# Patient Record
Sex: Female | Born: 1960
Health system: Southern US, Community
[De-identification: ages and names within clinical notes are randomized; demographics above are authoritative.]

## PROBLEM LIST (undated history)

## (undated) DIAGNOSIS — K219 Gastro-esophageal reflux disease without esophagitis: Secondary | ICD-10-CM

## (undated) DIAGNOSIS — M8580 Other specified disorders of bone density and structure, unspecified site: Secondary | ICD-10-CM

## (undated) DIAGNOSIS — E039 Hypothyroidism, unspecified: Secondary | ICD-10-CM

## (undated) DIAGNOSIS — I82409 Acute embolism and thrombosis of unspecified deep veins of unspecified lower extremity: Secondary | ICD-10-CM

## (undated) HISTORY — PX: WISDOM TOOTH EXTRACTION: SHX21

## (undated) HISTORY — DX: Other specified disorders of bone density and structure, unspecified site: M85.80

## (undated) HISTORY — DX: Hypothyroidism, unspecified: E03.9

---

## 2007-03-02 DIAGNOSIS — I82409 Acute embolism and thrombosis of unspecified deep veins of unspecified lower extremity: Secondary | ICD-10-CM

## 2007-03-02 HISTORY — DX: Acute embolism and thrombosis of unspecified deep veins of unspecified lower extremity: I82.409

## 2011-08-02 ENCOUNTER — Encounter (HOSPITAL_COMMUNITY): Payer: Self-pay | Admitting: Emergency Medicine

## 2011-08-02 ENCOUNTER — Emergency Department (HOSPITAL_COMMUNITY)
Admission: EM | Admit: 2011-08-02 | Discharge: 2011-08-03 | Disposition: A | Discharge status: Home / Self Care | Payer: BC Managed Care – PPO | Attending: Emergency Medicine | Admitting: Emergency Medicine

## 2011-08-02 ENCOUNTER — Emergency Department (HOSPITAL_COMMUNITY): Payer: BC Managed Care – PPO

## 2011-08-02 DIAGNOSIS — Z86718 Personal history of other venous thrombosis and embolism: Secondary | ICD-10-CM | POA: Insufficient documentation

## 2011-08-02 DIAGNOSIS — I82409 Acute embolism and thrombosis of unspecified deep veins of unspecified lower extremity: Secondary | ICD-10-CM | POA: Insufficient documentation

## 2011-08-02 DIAGNOSIS — R079 Chest pain, unspecified: Secondary | ICD-10-CM

## 2011-08-02 DIAGNOSIS — K219 Gastro-esophageal reflux disease without esophagitis: Secondary | ICD-10-CM | POA: Insufficient documentation

## 2011-08-02 DIAGNOSIS — E039 Hypothyroidism, unspecified: Secondary | ICD-10-CM | POA: Insufficient documentation

## 2011-08-02 HISTORY — DX: Gastro-esophageal reflux disease without esophagitis: K21.9

## 2011-08-02 HISTORY — DX: Acute embolism and thrombosis of unspecified deep veins of unspecified lower extremity: I82.409

## 2011-08-02 LAB — DIFFERENTIAL
Basophils Absolute: 0.1 10*3/uL (ref 0.0–0.1)
Basophils Relative: 1 % (ref 0–1)
Eosinophils Absolute: 0.2 10*3/uL (ref 0.0–0.7)
Neutro Abs: 3.9 10*3/uL (ref 1.7–7.7)
Neutrophils Relative %: 58 % (ref 43–77)

## 2011-08-02 LAB — POCT I-STAT TROPONIN I: Troponin i, poc: 0.02 ng/mL (ref 0.00–0.08)

## 2011-08-02 LAB — COMPREHENSIVE METABOLIC PANEL
ALT: 16 U/L (ref 0–35)
AST: 18 U/L (ref 0–37)
Albumin: 4.1 g/dL (ref 3.5–5.2)
Alkaline Phosphatase: 68 U/L (ref 39–117)
Chloride: 101 mEq/L (ref 96–112)
Potassium: 3.7 mEq/L (ref 3.5–5.1)
Sodium: 140 mEq/L (ref 135–145)
Total Bilirubin: 0.2 mg/dL — ABNORMAL LOW (ref 0.3–1.2)
Total Protein: 7.3 g/dL (ref 6.0–8.3)

## 2011-08-02 LAB — CBC
Hemoglobin: 13.3 g/dL (ref 12.0–15.0)
MCH: 30.6 pg (ref 26.0–34.0)
MCHC: 34 g/dL (ref 30.0–36.0)
Platelets: 295 10*3/uL (ref 150–400)
RBC: 4.34 MIL/uL (ref 3.87–5.11)

## 2011-08-02 NOTE — ED Notes (Signed)
Patient declined continuous cardiac monitor at this time, states, "My EKG came back fine, I don't know about my blood work, but I really am feeling fine and am hoping to go home soon."  RN Kim informed

## 2011-08-02 NOTE — ED Notes (Signed)
Dr.Wentz to eval ecg 20:15

## 2011-08-02 NOTE — ED Provider Notes (Signed)
History     CSN: [id number]  Arrival date & time 08/02/11  2011   First MD Initiated Contact with Patient 08/02/11 2317      Chief Complaint  Patient presents with  . Chest Pain    (Consider location/radiation/quality/duration/timing/severity/associated sxs/prior treatment) HPI Comments: 51 y/o female with no sig PMH other than isolated DVT in 2008 after leg fracture - spent 9 months on anticoagulation and then has had no problems since. She does travel for work on an airplane frequently but has had no leg swelling, no shortness of breath, back pain. She states that she awoke this morning with a feeling of pressure in the middle of her chest which is consistent with her frequent acid reflux disease. She had some improvement in her symptoms after taking her reflux medication but this persisted throughout most of the day. At this time it has resolved. During dinner this evening she developed a more intense crampy left-sided pain under her left breast which lasted approximately one hour and then resolved spontaneously. At this time she has no complaints including no shortness of breath, no chest pain and no lower extremity symptoms. This was not made worse with breathing. She states that she exercises several times a week, she is able to do an elliptical machine 2 miles at a time, 200 symptoms today along with weight lifting routines. She has no problems with exertion. She also states that she has had a recent cardiac evaluation with what she describes as a CT coronary evaluation which she states she was told was clean of any coronary blockages. She does not have hypertension no diabetes, no smoking, no cholesterol and she reports that she is adopted and is unsure of her family history.  Patient is a 51 y.o. female presenting with chest pain. The history is provided by the patient.  Chest Pain     Past Medical History  Diagnosis Date  . DVT (deep venous thrombosis)   . Hypothyroid   . GERD  (gastroesophageal reflux disease)     History reviewed. No pertinent past surgical history.  History reviewed. No pertinent family history.  History  Substance Use Topics  . Smoking status: Not on file  . Smokeless tobacco: Not on file  . Alcohol Use: No    OB History    Grav Para Term Preterm Abortions TAB SAB Ect Mult Living                  Review of Systems  Cardiovascular: Positive for chest pain.  All other systems reviewed and are negative.    Allergies  Review of patient's allergies indicates no known allergies.  Home Medications   Current Outpatient Rx  Name Route Sig Dispense Refill  . ASPIRIN EC 81 MG PO TBEC Oral Take 81 mg by mouth daily.    Marland Kitchen CALCIUM 600 + D PO Oral Take 1 tablet by mouth daily.    Marland Kitchen FLAXSEED (LINSEED) PO Oral Take 1 tablet by mouth daily.    Marland Kitchen LANSOPRAZOLE 15 MG PO CPDR Oral Take 15 mg by mouth daily.    Marland Kitchen LEVOTHYROXINE SODIUM 88 MCG PO TABS Oral Take 88 mcg by mouth See admin instructions. Take every day except Sundays.    . ADULT MULTIVITAMIN W/MINERALS CH Oral Take 1 tablet by mouth daily.    Marland Kitchen FISH OIL PO Oral Take 720 mg by mouth daily.    Marland Kitchen VITAMIN C 500 MG PO TABS Oral Take 500 mg by mouth daily.  BP 128/79  Pulse 61  Temp(Src) 98.3 F (36.8 C) (Oral)  Resp 16  SpO2 100%  Physical Exam  Nursing note and vitals reviewed. Constitutional: She appears well-developed and well-nourished. No distress.  HENT:  Head: Normocephalic and atraumatic.  Mouth/Throat: Oropharynx is clear and moist. No oropharyngeal exudate.  Eyes: Conjunctivae and EOM are normal. Pupils are equal, round, and reactive to light. Right eye exhibits no discharge. Left eye exhibits no discharge. No scleral icterus.  Neck: Normal range of motion. Neck supple. No JVD present. No thyromegaly present.  Cardiovascular: Normal rate, regular rhythm, normal heart sounds and intact distal pulses.  Exam reveals no gallop and no friction rub.   No murmur  heard. Pulmonary/Chest: Effort normal and breath sounds normal. No respiratory distress. She has no wheezes. She has no rales.  Abdominal: Soft. Bowel sounds are normal. She exhibits no distension and no mass. There is no tenderness.  Musculoskeletal: Normal range of motion. She exhibits no edema and no tenderness.  Lymphadenopathy:    She has no cervical adenopathy.  Neurological: She is alert. Coordination normal.  Skin: Skin is warm and dry. No rash noted. No erythema.  Psychiatric: She has a normal mood and affect. Her behavior is normal.    ED Course  Procedures (including critical care time)  ED ECG REPORT   Date: 08/03/2011   Rate: 55  Rhythm: sinus bradycardia  QRS Axis: normal  Intervals: normal  ST/T Wave abnormalities: normal  Conduction Disutrbances:none  Narrative Interpretation:   Old EKG Reviewed: none available   Labs Reviewed  COMPREHENSIVE METABOLIC PANEL - Abnormal; Notable for the following:    Total Bilirubin 0.2 (*)    GFR calc non Af Amer 63 (*)    GFR calc Af Amer 73 (*)    All other components within normal limits  CBC  DIFFERENTIAL  POCT I-STAT TROPONIN I  D-DIMER, QUANTITATIVE   Dg Chest 2 View  08/02/2011  *RADIOLOGY REPORT*  Clinical Data: Left-sided chest pain.  CHEST - 2 VIEW  Comparison: No priors.  Findings: Lung volumes are normal.  No consolidative airspace disease.  No pleural effusions.  No pneumothorax.  No pulmonary nodule or mass noted.  Pulmonary vasculature and the cardiomediastinal silhouette are within normal limits. Atherosclerotic calcifications in the arch of the aorta.  IMPRESSION: 1. No radiographic evidence of acute cardiopulmonary disease. 2.  Atherosclerosis.  Original Report Authenticated By: Florencia Reasons, M.D.     1. Chest pain       MDM  Physical exam is benign, there is no signs of lower extremity abnormalities, no signs of deep venous thrombosis, normal vital signs including pulse respirations and oxygen  saturations. Her chest x-ray is negative for acute findings and her lab work is unremarkable. Will add a d-dimer to rule out pulmonary embolism due to the patient's history of venous thrombosis as well as her history of frequent travel.   D-dimer negative, patient stable for discharge, low risk for pulmonary embolism given normal d-dimer, very low risk for acute coronary syndrome. Patient informed of results    Vida Roller, MD 08/03/11 [phone]

## 2011-08-02 NOTE — ED Notes (Signed)
Patient reports having indigestion most of the day; after eating dinner, began to experience left-sided chest pain.  Denies radiation of pain, shortness of breath, nausea, vomiting, and dizziness.  Denies cardiac history.

## 2011-08-02 NOTE — ED Notes (Signed)
MD at bedside. 

## 2011-08-03 NOTE — Discharge Instructions (Signed)
Your caregiver has diagnosed you as having chest pain that is nonspecific for one problem. This means that after looking at you and examining you and ordering tests (such as blood work, chest x-rays and EKG), your caregiver does not believe that the problem is serious enough to need watching in the hospital. This judgment is often made after testing shows no acute heart attack and you are at low risk for sudden acute heart condition. Chest pain comes from many different causes.  Seek immediate medical attention if:   You have severe chest pain, especially if the pain is crushing or pressure-like and spreads to the arms, back, neck, or jaw, or if you have sweating, nausea, shortness of breath. This is an emergency. Don't wait to see if the pain will go away. Get medical help at once. Call 911 immediately. Do not drive herself to the hospital.   Your chest pain gets worse and does not go away with rest.   You have an attack of chest pain lasting longer than usual, despite rest and treatment with the medications your caregiver has prescribed   You awaken from sleep with chest pain or shortness of breath.   You feel faint or dizzy   You have chest pain not typical of your usual pain for which you originally saw your caregiver.  You must have a repeat evaluation within 24 hours for a recheck of your heart.  Please call your doctor this morning to schedule this appointment. If you do not have a family doctor, please see the list of doctors below.  RESOURCE GUIDE  Dental Problems  Patients with Medicaid: [facility]                     [facility] [address]                                           [address] Phone:  [phone]                                                  Phone:  [phone]  If unable to pay or uninsured, contact:  [facility] or [facility] to become qualified for the adult dental clinic.  Chronic Pain  Problems Contact [facility]  [phone] Patients need to be referred by their primary care doctor.  Insufficient Money for Medicine Contact [employer]:  call "211" or [facility] 271-5999.  No Primary Care Doctor Call [facility]  832-8000 Other agencies that provide inexpensive medical care    [facility]  [phone]    [facility]  [phone]    [facility]  [phone]    [facility] Clinic  [phone]    [facility]  [phone]    [facility]  [phone]  Psychological Services [facility]  [phone] [facility]  [phone] [facility]   [phone] (emergency services [phone])  Substance Abuse Resources [facility]  336-882-2125 [facility] 336-784-9470 [facility] 336-285-9073 [facility] 336-845-3988 Residential & Outpatient Substance Abuse Program  800-659-3381  Abuse/Neglect [place] Child Abuse Hotline (336) 641-3795 [place] [facility] Abuse Hotline 800-378-5315 (After Hours)  Emergency Shelter [facility] (336) 271-5985  [facility] (336) 275-9566 [facility] (704) 372-4663  MRSA Hotline #:   832-7006    [place] [facility]  [facility] [place]     [employer]                          [facility] [address] [place]                       [address]      [address]  [place]                                                [place]                            [place] Phone:  [phone]                                   Phone:  [phone]                 Phone:  [phone]  [facility] Phone:  [phone]  [place] Child Abuse Hotline (336) 342-1394 (336) 342-3537 (After Hours)    

## 2017-01-19 DIAGNOSIS — M859 Disorder of bone density and structure, unspecified: Secondary | ICD-10-CM | POA: Diagnosis not present

## 2017-01-19 DIAGNOSIS — E038 Other specified hypothyroidism: Secondary | ICD-10-CM | POA: Diagnosis not present

## 2017-01-19 DIAGNOSIS — E7849 Other hyperlipidemia: Secondary | ICD-10-CM | POA: Diagnosis not present

## 2017-01-19 DIAGNOSIS — Z0000 Encounter for general adult medical examination without abnormal findings: Secondary | ICD-10-CM | POA: Diagnosis not present

## 2017-01-26 DIAGNOSIS — M859 Disorder of bone density and structure, unspecified: Secondary | ICD-10-CM | POA: Diagnosis not present

## 2017-01-26 DIAGNOSIS — R030 Elevated blood-pressure reading, without diagnosis of hypertension: Secondary | ICD-10-CM | POA: Diagnosis not present

## 2017-01-26 DIAGNOSIS — E7849 Other hyperlipidemia: Secondary | ICD-10-CM | POA: Diagnosis not present

## 2017-01-26 DIAGNOSIS — Z86718 Personal history of other venous thrombosis and embolism: Secondary | ICD-10-CM | POA: Diagnosis not present

## 2017-01-26 DIAGNOSIS — Z1389 Encounter for screening for other disorder: Secondary | ICD-10-CM | POA: Diagnosis not present

## 2017-01-26 DIAGNOSIS — Z0000 Encounter for general adult medical examination without abnormal findings: Secondary | ICD-10-CM | POA: Diagnosis not present

## 2017-02-02 DIAGNOSIS — Z1212 Encounter for screening for malignant neoplasm of rectum: Secondary | ICD-10-CM | POA: Diagnosis not present

## 2017-06-27 ENCOUNTER — Other Ambulatory Visit: Payer: Self-pay | Admitting: Obstetrics & Gynecology

## 2017-06-27 DIAGNOSIS — Z1231 Encounter for screening mammogram for malignant neoplasm of breast: Secondary | ICD-10-CM

## 2017-07-09 ENCOUNTER — Emergency Department (HOSPITAL_COMMUNITY)
Admission: EM | Admit: 2017-07-09 | Discharge: 2017-07-10 | Disposition: A | Discharge status: Home / Self Care | Payer: BLUE CROSS/BLUE SHIELD | Attending: Emergency Medicine | Admitting: Emergency Medicine

## 2017-07-09 ENCOUNTER — Emergency Department (HOSPITAL_COMMUNITY): Payer: BLUE CROSS/BLUE SHIELD

## 2017-07-09 ENCOUNTER — Other Ambulatory Visit: Payer: Self-pay

## 2017-07-09 ENCOUNTER — Encounter (HOSPITAL_COMMUNITY): Payer: Self-pay

## 2017-07-09 DIAGNOSIS — Z86718 Personal history of other venous thrombosis and embolism: Secondary | ICD-10-CM | POA: Diagnosis not present

## 2017-07-09 DIAGNOSIS — Z7982 Long term (current) use of aspirin: Secondary | ICD-10-CM | POA: Diagnosis not present

## 2017-07-09 DIAGNOSIS — E039 Hypothyroidism, unspecified: Secondary | ICD-10-CM | POA: Insufficient documentation

## 2017-07-09 DIAGNOSIS — H538 Other visual disturbances: Secondary | ICD-10-CM | POA: Insufficient documentation

## 2017-07-09 NOTE — ED Provider Notes (Signed)
[facility] DEPT Provider Note   CSN: [id number] Arrival date & time: 07/09/17  1526     History   Chief Complaint Chief Complaint  Patient presents with  . Blurred Vision    HPI Vanessa Bruce is a 57 y.o. female presenting for evaluation of vision changes.   Patient states that yesterday evening she was reading when her left eye became blurry.  She states it was like she was looking through broken glass.  This lasted for about 20 minutes before resolving without intervention.  Today she had similar episode of her right eye, lasting about 10 minutes.  She is currently symptom-free.  She denies any other symptoms including fevers, chills, headache, slurred speech, decreased concentration, chest pain, shortness of breath, cough, nausea, vomiting, abdominal pain, urinary symptoms, normal bowel movements, leg pain or swelling.  She denies recent travel, surgeries, immobilization, estrogen use, or history of cancer.  She has a history of a DVT 6 years ago after leg fracture, no history of any since.  She is not on blood thinners.  She has a history of hypothyroidism for which he takes medication and GERD, no other medical problems.  No history of cardiac or pulmonary problems.  She does not smoke cigarettes, denies alcohol or drug use.  No history of high blood pressure, but states she has white coat syndrome.  HPI  Past Medical History:  Diagnosis Date  . DVT (deep venous thrombosis) ([place])   . GERD (gastroesophageal reflux disease)   . Hypothyroid     Patient Active Problem List   Diagnosis Date Noted  . DVT (deep venous thrombosis) ([place])   . Hypothyroid   . GERD (gastroesophageal reflux disease)     History reviewed. No pertinent surgical history.   OB History   None      Home Medications    Prior to Admission medications   Medication Sig Start Date End Date Taking? Authorizing Provider  aspirin EC 81 MG tablet Take 81 mg by mouth daily.     [provider]  Calcium Carbonate-Vitamin D (CALCIUM 600 + D PO) Take 1 tablet by mouth daily.    [provider]  FLAXSEED, LINSEED, PO Take 1 tablet by mouth daily.    [provider]  lansoprazole (PREVACID) 15 MG capsule Take 15 mg by mouth daily.    [provider]  levothyroxine (SYNTHROID, LEVOTHROID) 88 MCG tablet Take 88 mcg by mouth See admin instructions. Take every day except Sundays.    [provider]  Multiple Vitamin (MULITIVITAMIN WITH MINERALS) TABS Take 1 tablet by mouth daily.    [provider]  Omega-3 Fatty Acids (FISH OIL PO) Take 720 mg by mouth daily.    [provider]  vitamin C (ASCORBIC ACID) 500 MG tablet Take 500 mg by mouth daily.    [provider]    Family History No family history on file.  Social History Social History   Tobacco Use  . Smoking status: Never Smoker  . Smokeless tobacco: Never Used  Substance Use Topics  . Alcohol use: No  . Drug use: No     Allergies   Patient has no known allergies.   Review of Systems Review of Systems  Eyes: Positive for visual disturbance (resolved).  All other systems reviewed and are negative.    Physical Exam Updated Vital Signs BP (!) 138/94 (BP Location: Right Arm)   Pulse (!) 50   Temp 98.6 F (37  C) (Oral)   Resp 16   Ht 5' 7.5" (1.715 m)   Wt 74.8 kg (165 lb)   SpO2 99%   BMI 25.46 kg/m   Physical Exam  Constitutional: She is oriented to person, place, and time. She appears well-developed and well-nourished. No distress.  Appears in no distress.  HENT:  Head: Normocephalic and atraumatic.  Right Ear: Tympanic membrane, external ear and ear canal normal.  Left Ear: Tympanic membrane, external ear and ear canal normal.  Nose: Nose normal.  Mouth/Throat: Uvula is midline, oropharynx is clear and moist and mucous membranes are normal.  Eyes: Pupils are equal, round, and reactive to light. Conjunctivae, EOM and  lids are normal.  Fundoscopic exam:      The right eye shows no AV nicking, no exudate, no hemorrhage and no papilledema.       The left eye shows no AV nicking, no exudate, no hemorrhage and no papilledema.  EOMI and PERRLA.  No nystagmus.  Funduscopic exam reassuring.  Neck: Normal range of motion. Neck supple.  Cardiovascular: Normal rate, regular rhythm and intact distal pulses.  Pulmonary/Chest: Effort normal and breath sounds normal. No respiratory distress. She has no wheezes.  Speaking in full sentences.  Clear lung sounds in all fields.  Abdominal: Soft. Bowel sounds are normal. She exhibits no distension and no mass. There is no tenderness. There is no guarding.  Musculoskeletal: Normal range of motion.  Strength intact x4.  Sensation intact x4.  Radial and pedal pulses intact bilaterally.  No leg pain or swelling.  Neurological: She is alert and oriented to person, place, and time. She has normal strength and normal reflexes. No cranial nerve deficit or sensory deficit. She displays a negative Romberg sign. Coordination and gait normal. GCS eye subscore is 4. GCS verbal subscore is 5. GCS motor subscore is 6.  No obvious neurologic deficits.  CN intact.  Fine movement and coordination intact.  Skin: Skin is warm and dry.  Psychiatric: She has a normal mood and affect.  Nursing note and vitals reviewed.    ED Treatments / Results  Labs (all labs ordered are listed, but only abnormal results are displayed) Labs Reviewed  COMPREHENSIVE METABOLIC PANEL - Abnormal; Notable for the following components:      Result Value   Total Bilirubin 0.1 (*)    All other components within normal limits  URINALYSIS, ROUTINE W REFLEX MICROSCOPIC - Abnormal; Notable for the following components:   Leukocytes, UA SMALL (*)    Bacteria, UA RARE (*)    All other components within normal limits  CBC    EKG None  Radiology Ct Head Wo Contrast  Result Date: 07/10/2017 CLINICAL DATA:  Two  episodes of blurry vision since Jul 08, 2017. EXAM: CT HEAD WITHOUT CONTRAST TECHNIQUE: Contiguous axial images were obtained from the base of the skull through the vertex without intravenous contrast. COMPARISON:  None. FINDINGS: BRAIN: No intraparenchymal hemorrhage, mass effect nor midline shift. The ventricles and sulci are normal for age no acute large vascular territory infarcts. No abnormal extra-axial fluid collections. Basal cisterns are patent. VASCULAR: Moderate calcific atherosclerosis of the carotid siphons. SKULL: No skull fracture. No significant scalp soft tissue swelling. SINUSES/ORBITS: The mastoid air-cells and included paranasal sinuses are well-aerated.The included ocular globes and orbital contents are non-suspicious. OTHER: None. IMPRESSION: Normal noncontrast CT HEAD. Electronically Signed   By: Elon Alas M.D.   On: 07/10/2017 00:38    Procedures Procedures (including critical care time)  Medications Ordered in ED Medications - No data to display   Initial Impression / Assessment and Plan / ED Course  I have reviewed the triage vital signs and the nursing notes.  Pertinent labs & imaging results that were available during my care of the patient were reviewed by me and considered in my medical decision making (see chart for details).     Presenting for evaluation of vision changes which have resolved.  Physical exam reassuring, no obvious neurologic deficits.  Blood pressure mildly elevated 150s over 100.  Eye exam reassuring.  Will obtain basic labs, urine, and CT head.  No chest pain or shortness of breath, do not believe troponin or EKG is necessary at this time.  Labs reassuring, no kidney injury.  Elect lites stable.  No leukocytosis.  UA without infection. CT head without acute findings.  At this time, doubt CVA, TIA, acute angle glaucoma, infection, or blood clot.  Discussed findings with patient.  Discussed follow-up with ophthalmology for further evaluation  of her vision.  Discussed follow-up with primary care for further evaluation of her blood pressure.  Blood pressure is improved in the ER without intervention.  At this time, patient appears safe for discharge.  Return precautions given.  Patient states she understands and agrees to plan.   Final Clinical Impressions(s) / ED Diagnoses   Final diagnoses:  Blurred vision    ED Discharge Orders    None       Franchot Heidelberg, PA-C 96/29/52 [id number]    Delora Fuel, MD 24/40/10 [phone]

## 2017-07-09 NOTE — ED Triage Notes (Addendum)
Pt states while reading last night, she had blurred vision (like looking through glass) on left side for approx 20 mins, and then right side today for 10 mins, opposite eyes. Pt has no neuro/visual deficits in triage. Pt states that she has noticed elevated Bps lately, 150/100 on home machine.

## 2017-07-10 DIAGNOSIS — H538 Other visual disturbances: Secondary | ICD-10-CM | POA: Diagnosis not present

## 2017-07-10 LAB — URINALYSIS, ROUTINE W REFLEX MICROSCOPIC
BILIRUBIN URINE: NEGATIVE
GLUCOSE, UA: NEGATIVE mg/dL
Hgb urine dipstick: NEGATIVE
KETONES UR: NEGATIVE mg/dL
Nitrite: NEGATIVE
PH: 5 (ref 5.0–8.0)
Protein, ur: NEGATIVE mg/dL
Specific Gravity, Urine: 1.016 (ref 1.005–1.030)

## 2017-07-10 LAB — COMPREHENSIVE METABOLIC PANEL
ALT: 19 U/L (ref 14–54)
ANION GAP: 13 (ref 5–15)
AST: 20 U/L (ref 15–41)
Albumin: 4.3 g/dL (ref 3.5–5.0)
Alkaline Phosphatase: 78 U/L (ref 38–126)
BILIRUBIN TOTAL: 0.1 mg/dL — AB (ref 0.3–1.2)
BUN: 16 mg/dL (ref 6–20)
CO2: 22 mmol/L (ref 22–32)
Calcium: 9.2 mg/dL (ref 8.9–10.3)
Chloride: 105 mmol/L (ref 101–111)
Creatinine, Ser: 0.91 mg/dL (ref 0.44–1.00)
GFR calc Af Amer: >60 mL/min (ref >60)
Glucose, Bld: 93 mg/dL (ref 65–99)
POTASSIUM: 3.6 mmol/L (ref 3.5–5.1)
Sodium: 140 mmol/L (ref 135–145)
TOTAL PROTEIN: 7.7 g/dL (ref 6.5–8.1)

## 2017-07-10 LAB — CBC
HEMATOCRIT: 39.7 % (ref 36.0–46.0)
Hemoglobin: 12.9 g/dL (ref 12.0–15.0)
MCH: 28.4 pg (ref 26.0–34.0)
MCHC: 32.5 g/dL (ref 30.0–36.0)
MCV: 87.3 fL (ref 78.0–100.0)
Platelets: 324 10*3/uL (ref 150–400)
RBC: 4.55 MIL/uL (ref 3.87–5.11)
RDW: 13.9 % (ref 11.5–15.5)
WBC: 5.7 10*3/uL (ref 4.0–10.5)

## 2017-07-10 NOTE — Discharge Instructions (Addendum)
Continue taking your medications as prescribed. Follow-up with your primary care doctor for further evaluation of your blood pressure. Follow-up with ophthalmology for further evaluation of your eyes. Return to the emergency room if you develop headache, persistent vision changes, slurred speech, numbness, or any new or concerning symptoms.

## 2017-07-20 ENCOUNTER — Ambulatory Visit
Admission: RE | Admit: 2017-07-20 | Discharge: 2017-07-20 | Disposition: A | Discharge status: Home / Self Care | Payer: BLUE CROSS/BLUE SHIELD | Source: Ambulatory Visit | Attending: Obstetrics & Gynecology | Admitting: Obstetrics & Gynecology

## 2017-07-20 DIAGNOSIS — Z1231 Encounter for screening mammogram for malignant neoplasm of breast: Secondary | ICD-10-CM

## 2017-10-13 ENCOUNTER — Ambulatory Visit: Payer: BLUE CROSS/BLUE SHIELD | Admitting: Obstetrics & Gynecology

## 2017-10-13 ENCOUNTER — Encounter: Payer: Self-pay | Admitting: Obstetrics & Gynecology

## 2017-10-13 VITALS — BP 130/90 | Ht 67.25 in | Wt 167.0 lb

## 2017-10-13 DIAGNOSIS — M8589 Other specified disorders of bone density and structure, multiple sites: Secondary | ICD-10-CM | POA: Diagnosis not present

## 2017-10-13 DIAGNOSIS — Z01419 Encounter for gynecological examination (general) (routine) without abnormal findings: Secondary | ICD-10-CM | POA: Diagnosis not present

## 2017-10-13 DIAGNOSIS — Z780 Asymptomatic menopausal state: Secondary | ICD-10-CM

## 2017-10-13 DIAGNOSIS — R6882 Decreased libido: Secondary | ICD-10-CM

## 2017-10-13 NOTE — Patient Instructions (Signed)
1. Encounter for routine gynecological examination with Papanicolaou smear of cervix Normal gynecologic exam.  Pap reflex done.  Breast exam normal.  Screening mammogram was negative in May 2019.  Colonoscopy in 2012.  Health labs with family physician.  2. Menopause present Well on no hormone replacement therapy.  No postmenopausal bleeding.  Using lubricants.  May try Coconut oil.  3. Osteopenia of multiple sites Osteopenia on last bone density followed by her family physician.  Recommend vitamin D supplement, calcium intake of 1.2 to 1.5 g/day including nutritional and supplemental calcium, and regular weightbearing physical activity.  4. Low libido Counseling on decreased libido after menopause.  Usually a multifactorial condition.  Testosterone treatment discussed, but patient prefers trying more natural solutions.  Will work on some behavioral and psychologic factors at this point.  Vanessa Bruce, it was a pleasure seeing you today!  I will inform you of your results as soon as they are available.

## 2017-10-13 NOTE — Progress Notes (Signed)
Vanessa Bruce February 01, 1961 [id number]   History:    57 y.o. G0  Married ([place] man).  Patient's mother passed away last week at 84+ yo.  Planning a long camper trip with her [place]  RP:  Established patient presenting for annual gyn exam   HPI: Menopause, well on no HRT.  No PMB.  No pelvic pain.  Decreased libido, but no pain with intercourse using lubricant.  Urine/BMs wnl.  Breasts wnl.  BMI 25.96.  Physically active.  Health labs with Fam MD.  Colono 2012.  Bone density Osteopenia per patient.    Past medical history,surgical history, family history and social history were all reviewed and documented in the EPIC chart.  Gynecologic History No LMP recorded. Patient is postmenopausal. Contraception: post menopausal status Last Pap: 03/2015. Results were: Negative/HPV HR neg Last mammogram: 06/2017 Results were: Negative Bone Density: Osteopenia.  Followed by Heriberto Antigua MD Colonoscopy: 2012  Obstetric History OB History  Gravida Para Term Preterm AB Living  0 0 0 0 0 0  SAB TAB Ectopic Multiple Live Births  0 0 0 0 0     ROS: A ROS was performed and pertinent positives and negatives are included in the history.  GENERAL: No fevers or chills. HEENT: No change in vision, no earache, sore throat or sinus congestion. NECK: No pain or stiffness. CARDIOVASCULAR: No chest pain or pressure. No palpitations. PULMONARY: No shortness of breath, cough or wheeze. GASTROINTESTINAL: No abdominal pain, nausea, vomiting or diarrhea, melena or bright red blood per rectum. GENITOURINARY: No urinary frequency, urgency, hesitancy or dysuria. MUSCULOSKELETAL: No joint or muscle pain, no back pain, no recent trauma. DERMATOLOGIC: No rash, no itching, no lesions. ENDOCRINE: No polyuria, polydipsia, no heat or cold intolerance. No recent change in weight. HEMATOLOGICAL: No anemia or easy bruising or bleeding. NEUROLOGIC: No headache, seizures, numbness, tingling or weakness. PSYCHIATRIC:  No depression, no loss of interest in normal activity or change in sleep pattern.     Exam:   BP 130/90   Ht 5' 7.25" (1.708 m)   Wt 167 lb (75.8 kg)   BMI 25.96 kg/m   Body mass index is 25.96 kg/m.  General appearance : Well developed well nourished female. No acute distress HEENT: Eyes: no retinal hemorrhage or exudates,  Neck supple, trachea midline, no carotid bruits, no thyroidmegaly Lungs: Clear to auscultation, no rhonchi or wheezes, or rib retractions  Heart: Regular rate and rhythm, no murmurs or gallops Breast:Examined in sitting and supine position were symmetrical in appearance, no palpable masses or tenderness,  no skin retraction, no nipple inversion, no nipple discharge, no skin discoloration, no axillary or supraclavicular lymphadenopathy Abdomen: no palpable masses or tenderness, no rebound or guarding Extremities: no edema or skin discoloration or tenderness  Pelvic: Vulva: Normal             Vagina: No gross lesions or discharge  Cervix: No gross lesions or discharge.  Pap reflex done.  Uterus  AV, normal size, shape and consistency, non-tender and mobile  Adnexa  Without masses or tenderness  Anus: Normal   Assessment/Plan:  57 y.o. female for annual exam   1. Encounter for routine gynecological examination with Papanicolaou smear of cervix Normal gynecologic exam.  Pap reflex done.  Breast exam normal.  Screening mammogram was negative in May 2019.  Colonoscopy in 2012.  Health labs with family physician.  2. Menopause present Well on no hormone replacement therapy.  No postmenopausal bleeding.  Using lubricants.  May try Coconut oil.  3. Osteopenia of multiple sites Osteopenia on last bone density followed by her family physician.  Recommend vitamin D supplement, calcium intake of 1.2 to 1.5 g/day including nutritional and supplemental calcium, and regular weightbearing physical activity.  4. Low libido Counseling on decreased libido after menopause.   Usually a multifactorial condition.  Testosterone treatment discussed, but patient prefers trying more natural solutions.  Will work on some behavioral and psychologic factors at this point.   Princess Bruins MD, 9:53 AM 10/13/2017

## 2017-10-17 LAB — PAP IG W/ RFLX HPV ASCU

## 2017-11-26 DIAGNOSIS — Z23 Encounter for immunization: Secondary | ICD-10-CM | POA: Diagnosis not present

## 2018-01-16 DIAGNOSIS — E038 Other specified hypothyroidism: Secondary | ICD-10-CM | POA: Diagnosis not present

## 2018-01-16 DIAGNOSIS — M25562 Pain in left knee: Secondary | ICD-10-CM | POA: Diagnosis not present

## 2018-01-16 DIAGNOSIS — Z0000 Encounter for general adult medical examination without abnormal findings: Secondary | ICD-10-CM | POA: Diagnosis not present

## 2018-01-16 DIAGNOSIS — M859 Disorder of bone density and structure, unspecified: Secondary | ICD-10-CM | POA: Diagnosis not present

## 2018-01-23 DIAGNOSIS — Z1389 Encounter for screening for other disorder: Secondary | ICD-10-CM | POA: Diagnosis not present

## 2018-01-23 DIAGNOSIS — M859 Disorder of bone density and structure, unspecified: Secondary | ICD-10-CM | POA: Diagnosis not present

## 2018-01-23 DIAGNOSIS — E7849 Other hyperlipidemia: Secondary | ICD-10-CM | POA: Diagnosis not present

## 2018-01-23 DIAGNOSIS — R030 Elevated blood-pressure reading, without diagnosis of hypertension: Secondary | ICD-10-CM | POA: Diagnosis not present

## 2018-01-23 DIAGNOSIS — Z0000 Encounter for general adult medical examination without abnormal findings: Secondary | ICD-10-CM | POA: Diagnosis not present

## 2018-01-23 DIAGNOSIS — K219 Gastro-esophageal reflux disease without esophagitis: Secondary | ICD-10-CM | POA: Diagnosis not present

## 2018-01-23 DIAGNOSIS — Z23 Encounter for immunization: Secondary | ICD-10-CM | POA: Diagnosis not present

## 2018-01-25 ENCOUNTER — Encounter: Payer: Self-pay | Admitting: Gastroenterology

## 2018-02-27 ENCOUNTER — Ambulatory Visit: Payer: BLUE CROSS/BLUE SHIELD | Admitting: Gastroenterology

## 2018-02-27 ENCOUNTER — Encounter: Payer: Self-pay | Admitting: Gastroenterology

## 2018-02-27 VITALS — BP 136/84 | HR 72 | Ht 66.63 in | Wt 171.5 lb

## 2018-02-27 DIAGNOSIS — K219 Gastro-esophageal reflux disease without esophagitis: Secondary | ICD-10-CM | POA: Diagnosis not present

## 2018-02-27 NOTE — Patient Instructions (Addendum)
Please avoid caffeine, alcohol, chocolate, peppermints, citrus, onions, carbonated beverages, and tomato-based foods. Consume acid and spicy foods in moderation.  Eat smaller meals and avoid eating for several hours before exercise or sleep.  Try not to eat within 3 hours of going to bed. Avoid bedtime snacks.  Elevate the head of your bed with blocks. Work to maintain a health weight. Even five pounds can make a difference. Take your reflux medication 30 minutes before breakfast.   You have been scheduled for an endoscopy. Please follow written instructions given to you at your visit today. If you use inhalers (even only as needed), please bring them with you on the day of your procedure. Your physician has requested that you go to www.startemmi.com and enter the access code given to you at your visit today. This web site gives a general overview about your procedure. However, you should still follow specific instructions given to you by our office regarding your preparation for the procedure.

## 2018-02-27 NOTE — Progress Notes (Signed)
Referring Provider: Velna Hatchet, MD Primary Care Physician:  Vanessa Hatchet, MD   Reason for Consultation: Acid reflux   IMPRESSION:  Reflux x 20 years    - previously well controlled     - now with ongoing regurgitation and xiphoid pressure despite daily PPI therapy Dysphonia times years NSAIDS for back pain Prior colonoscopy in 2012 in [place]  Reflux no longer responding to daily PPI therapy.  EGD recommended to exclude other/concurrent processes including, but not limited to,  PUD given her recent NSAIDs, gastritis, erosive esophagitis.   PLAN: EGD with esophageal biopsies Continue esomeprazole Reviewed GERD lifestyle modifications Obtain colonoscopy in 2012 in [place], [place] - with Dr. Venancio Bruce  I consented the patient at the bedside today discussing the risks, benefits, and alternatives to endoscopic evaluation. In particular, we discussed the risks that include, but are not limited to, reaction to medication, cardiopulmonary compromise, bleeding requiring blood transfusion, aspiration resulting in pneumonia, perforation requiring surgery, lack of diagnosis, severe illness requiring hospitalization, and even death. We reviewed the risk of missed lesion including polyps or even cancer. The patient acknowledges these risks and asks that we proceed.   HPI: Vanessa Bruce is a 57 y.o. 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  The history is obtained through the patient and review of her electronic health record as well as referral records from Vanessa Bruce.  20+ years of acid reflux. Treated with various PPI medications including twice daily therapy in the past. Continued to have breakthrough symptoms with substernal pain.  Vanessa Bruce change the pantoprazole to esmeperazole in November 2019. Some improvement in acid symptoms, but, no relief from  regurgitation and xiphoid pressure. If she eats quickly she can feel the food in her esophagus. Some dysphonia x years. No throat pain. No globus.  Exacerbated by stress. Triggered by sugar, caffeine, chocolate. Weight stable.    Tested for H pylori through Vanessa Bruce and was negative. No evidence for GI bleeding, iron deficiency anemia, anorexia, unexplained weight loss, dysphagia, odynophagia, persistent vomiting, or gastrointestinal cancer in a first-degree relative. No other associated symptoms. No identified exacerbating or relieving features.   She had a colonoscopy in 2012 in [place], [place] - with Dr. Venancio Bruce (primary care who would have the records).  No prior upper endoscopy.    Adopted.  Family history is unknown.   Back pain treated with ibuoprofen, to naproxen, to acetaminophen. Not more than every few weeks.   Labs from 01/16/18: Na 142, alb 4.0, TB 0.3, AST 16, ALT 19, alk phos 79, WBC 5.71, hgb 13, platelets 295.  TSH 0.10.  Past Medical History:  Diagnosis Date  . DVT (deep venous thrombosis) ([place])   . GERD (gastroesophageal reflux disease)   . Hypothyroidism     Past Surgical History:  Procedure Laterality Date  . WISDOM TOOTH EXTRACTION      Current Outpatient Medications  Medication Sig Dispense Refill  . esomeprazole (NEXIUM) 40 MG capsule Take 40 mg by mouth daily at 12 noon.    Marland Kitchen levothyroxine (SYNTHROID, LEVOTHROID) 100 MCG tablet Take 100 mcg by mouth daily before breakfast.     No current facility-administered medications for this visit.     Allergies as of 02/27/2018  . (No Known Allergies)    Family History  Adopted: Yes  Family history unknown: Yes    Social History   Socioeconomic  History  . Marital status: Divorced    Spouse name: Not on file  . Number of children: 0  . Years of education: Not on file  . Highest education level: Not on file  Occupational History  . Occupation: self employed  Social Needs  . Financial resource  strain: Not on file  . Food insecurity:    Worry: Not on file    Inability: Not on file  . Transportation needs:    Medical: Not on file    Non-medical: Not on file  Tobacco Use  . Smoking status: Former Smoker    Types: Cigarettes    Last attempt to quit: 10/14/1979    Years since quitting: 38.4  . Smokeless tobacco: Never Used  Substance and Sexual Activity  . Alcohol use: No  . Drug use: No  . Sexual activity: Yes    Partners: Male    Comment: 1st intercourse- 41, partners- 63, married- 4 yrs   Lifestyle  . Physical activity:    Days per week: Not on file    Minutes per session: Not on file  . Stress: Not on file  Relationships  . Social connections:    Talks on phone: Not on file    Gets together: Not on file    Attends religious service: Not on file    Active member of club or organization: Not on file    Attends meetings of clubs or organizations: Not on file    Relationship status: Not on file  . Intimate partner violence:    Fear of current or ex partner: Not on file    Emotionally abused: Not on file    Physically abused: Not on file    Forced sexual activity: Not on file  Other Topics Concern  . Not on file  Social History Narrative  . Not on file    Review of Systems: 12 system ROS is negative except as noted above.  Filed Weights   02/27/18 1531  Weight: 171 lb 8 oz (77.8 kg)    Physical Exam: Vital signs were reviewed. General:   Alert, well-nourished, pleasant and cooperative in NAD Head:  Normocephalic and atraumatic. Eyes:  Sclera clear, no icterus.   Conjunctiva pink. Mouth:  No deformity or lesions.   Neck:  Supple; no thyromegaly. Lungs:  Clear throughout to auscultation.   No wheezes.  Heart:  Regular rate and rhythm; no murmurs Abdomen:  Soft, nontender, normal bowel sounds. No rebound or guarding. No hepatosplenomegaly Rectal:  Deferred  Msk:  Symmetrical without gross deformities. Extremities:  No gross deformities or  edema. Neurologic:  Alert and  oriented x4;  grossly nonfocal Skin:  No rash or bruise. Psych:  Alert and cooperative. Normal mood and affect.   Vanessa Bruce L. Vanessa Glenn, MD, MPH [facility] 03/06/2018, 10:39 AM

## 2018-03-06 ENCOUNTER — Encounter: Payer: Self-pay | Admitting: Gastroenterology

## 2018-03-15 DIAGNOSIS — M859 Disorder of bone density and structure, unspecified: Secondary | ICD-10-CM | POA: Diagnosis not present

## 2018-03-17 ENCOUNTER — Ambulatory Visit (AMBULATORY_SURGERY_CENTER): Payer: BLUE CROSS/BLUE SHIELD | Admitting: Gastroenterology

## 2018-03-17 ENCOUNTER — Encounter: Payer: Self-pay | Admitting: Gastroenterology

## 2018-03-17 VITALS — BP 91/49 | HR 53 | Temp 98.0°F | Resp 17

## 2018-03-17 DIAGNOSIS — K219 Gastro-esophageal reflux disease without esophagitis: Secondary | ICD-10-CM | POA: Diagnosis not present

## 2018-03-17 MED ORDER — SODIUM CHLORIDE 0.9 % IV SOLN
500.0000 mL | Freq: Once | INTRAVENOUS | Status: DC
Start: 2018-03-17 — End: 2018-03-17

## 2018-03-17 NOTE — Op Note (Signed)
[facility] Patient Name: Vanessa Bruce Procedure Date: 03/17/2018 9:36 AM MRN: [id number] Endoscopist: Thornton Park MD, MD Age: 58 Referring MD:  Date of Birth: Apr 23, 1960 Gender: Female Account #: [id number] Procedure:                Upper GI endoscopy Indications:              Esophageal reflux symptoms that persist despite                            appropriate therapy Medicines:                See the Anesthesia note for documentation of the                            administered medications Procedure:                Pre-Anesthesia Assessment:                           - Prior to the procedure, a History and Physical                            was performed, and patient medications and                            allergies were reviewed. The patient's tolerance of                            previous anesthesia was also reviewed. The risks                            and benefits of the procedure and the sedation                            options and risks were discussed with the patient.                            All questions were answered, and informed consent                            was obtained. Prior Anticoagulants: The patient has                            taken no previous anticoagulant or antiplatelet                            agents. ASA Grade Assessment: II - A patient with                            mild systemic disease. After reviewing the risks                            and benefits, the patient was deemed in  satisfactory condition to undergo the procedure.                           After obtaining informed consent, the endoscope was                            passed under direct vision. Throughout the                            procedure, the patient's blood pressure, pulse, and                            oxygen saturations were monitored continuously. The                            Endoscope was introduced through the  mouth, and                            advanced to the third part of duodenum. The upper                            GI endoscopy was accomplished without difficulty.                            The patient tolerated the procedure well. Scope In: Scope Out: Findings:                 The examined esophagus was normal. Biopsies were                            obtained from the proximal and distal esophagus                            with cold forceps for histology of suspected                            eosinophilic esophagitis.                           The stomach was normal.                           The examined duodenum was normal.                           The exam was otherwise without abnormality. Complications:            No immediate complications. Estimated blood loss:                            None. Estimated Blood Loss:     Estimated blood loss: none. Impression:               - Normal esophagus. Biopsied.                           - Normal stomach.                           -  Normal examined duodenum.                           - The examination was otherwise normal. Recommendation:           - Discharge patient to home.                           - Resume regular diet.                           - Continue present medications.                           - Await pathology results.                           - Return to GI clinic. Thornton Park MD, MD 03/17/2018 9:52:48 AM This report has been signed electronically.

## 2018-03-17 NOTE — Progress Notes (Signed)
Called to room to assist during endoscopic procedure.  Patient ID and intended procedure confirmed with present staff. Received instructions for my participation in the procedure from the performing physician.  

## 2018-03-17 NOTE — Patient Instructions (Signed)
Await pathology results  Return to GI clinic.  YOU HAD AN ENDOSCOPIC PROCEDURE TODAY AT [facility]:   Refer to the procedure report that was given to you for any specific questions about what was found during the examination.  If the procedure report does not answer your questions, please call your gastroenterologist to clarify.  If you requested that your care partner not be given the details of your procedure findings, then the procedure report has been included in a sealed envelope for you to review at your convenience later.  YOU SHOULD EXPECT: Some feelings of bloating in the abdomen. Passage of more gas than usual.  Walking can help get rid of the air that was put into your GI tract during the procedure and reduce the bloating. If you had a lower endoscopy (such as a colonoscopy or flexible sigmoidoscopy) you may notice spotting of blood in your stool or on the toilet paper. If you underwent a bowel prep for your procedure, you may not have a normal bowel movement for a few days.  Please Note:  You might notice some irritation and congestion in your nose or some drainage.  This is from the oxygen used during your procedure.  There is no need for concern and it should clear up in a day or so.  SYMPTOMS TO REPORT IMMEDIATELY:    Following upper endoscopy (EGD)  Vomiting of blood or coffee ground material  New chest pain or pain under the shoulder blades  Painful or persistently difficult swallowing  New shortness of breath  Fever of 100F or higher  Black, tarry-looking stools  For urgent or emergent issues, a gastroenterologist can be reached at any hour by calling [phone].   DIET:  We do recommend a small meal at first, but then you may proceed to your regular diet.  Drink plenty of fluids but you should avoid alcoholic beverages for 24 hours.  ACTIVITY:  You should plan to take it easy for the rest of today and you should NOT DRIVE or use heavy machinery  until tomorrow (because of the sedation medicines used during the test).    FOLLOW UP: Our staff will call the number listed on your records the next business day following your procedure to check on you and address any questions or concerns that you may have regarding the information given to you following your procedure. If we do not reach you, we will leave a message.  However, if you are feeling well and you are not experiencing any problems, there is no need to return our call.  We will assume that you have returned to your regular daily activities without incident.  If any biopsies were taken you will be contacted by phone or by letter within the next 1-3 weeks.  Please call us at [phone] if you have not heard about the biopsies in 3 weeks.    SIGNATURES/CONFIDENTIALITY: You and/or your care partner have signed paperwork which will be entered into your electronic medical record.  These signatures attest to the fact that that the information above on your After Visit Summary has been reviewed and is understood.  Full responsibility of the confidentiality of this discharge information lies with you and/or your care-partner.

## 2018-03-17 NOTE — Progress Notes (Signed)
To PACU, VSS. Report to [email or web address] 

## 2018-03-20 ENCOUNTER — Telehealth: Payer: Self-pay | Admitting: *Deleted

## 2018-03-20 NOTE — Telephone Encounter (Signed)
  Follow up Call-  Call back number 03/17/2018  Post procedure Call Back phone  # [phone]  Permission to leave phone message Yes  Some recent data might be hidden     Patient questions:  Do you have a fever, pain , or abdominal swelling? No. Pain Score  0 *  Have you tolerated food without any problems? Yes.    Have you been able to return to your normal activities? Yes.    Do you have any questions about your discharge instructions: Diet   No. Medications  No. Follow up visit  No.  Do you have questions or concerns about your Care? No.  Actions: * If pain score is 4 or above: No action needed, pain <4.  Had a transient headache that persisted through the day and resolved by the next day.  No other questions or concerns.

## 2018-04-10 ENCOUNTER — Other Ambulatory Visit: Payer: Self-pay | Admitting: Internal Medicine

## 2018-04-10 ENCOUNTER — Ambulatory Visit
Admission: RE | Admit: 2018-04-10 | Discharge: 2018-04-10 | Disposition: A | Discharge status: Home / Self Care | Payer: BLUE CROSS/BLUE SHIELD | Source: Ambulatory Visit | Attending: Internal Medicine | Admitting: Internal Medicine

## 2018-04-10 DIAGNOSIS — R1031 Right lower quadrant pain: Secondary | ICD-10-CM

## 2018-04-10 DIAGNOSIS — Z6826 Body mass index (BMI) 26.0-26.9, adult: Secondary | ICD-10-CM | POA: Diagnosis not present

## 2018-04-10 DIAGNOSIS — K219 Gastro-esophageal reflux disease without esophagitis: Secondary | ICD-10-CM | POA: Diagnosis not present

## 2018-04-10 DIAGNOSIS — R82998 Other abnormal findings in urine: Secondary | ICD-10-CM | POA: Diagnosis not present

## 2018-04-10 DIAGNOSIS — N390 Urinary tract infection, site not specified: Secondary | ICD-10-CM | POA: Diagnosis not present

## 2018-04-10 MED ORDER — IOPAMIDOL (ISOVUE-300) INJECTION 61%
100.0000 mL | Freq: Once | INTRAVENOUS | Status: AC | PRN
  Administered 2018-04-10: 100 mL via INTRAVENOUS

## 2018-04-11 DIAGNOSIS — R82998 Other abnormal findings in urine: Secondary | ICD-10-CM | POA: Diagnosis not present

## 2018-04-11 DIAGNOSIS — N390 Urinary tract infection, site not specified: Secondary | ICD-10-CM | POA: Diagnosis not present

## 2018-04-11 DIAGNOSIS — K654 Sclerosing mesenteritis: Secondary | ICD-10-CM | POA: Diagnosis not present

## 2018-04-12 ENCOUNTER — Encounter: Payer: Self-pay | Admitting: Gastroenterology

## 2018-04-12 ENCOUNTER — Other Ambulatory Visit (INDEPENDENT_AMBULATORY_CARE_PROVIDER_SITE_OTHER): Payer: BLUE CROSS/BLUE SHIELD

## 2018-04-12 ENCOUNTER — Encounter (INDEPENDENT_AMBULATORY_CARE_PROVIDER_SITE_OTHER): Payer: Self-pay

## 2018-04-12 ENCOUNTER — Ambulatory Visit: Payer: BLUE CROSS/BLUE SHIELD | Admitting: Gastroenterology

## 2018-04-12 VITALS — BP 132/100 | HR 68 | Ht 66.63 in | Wt 171.1 lb

## 2018-04-12 DIAGNOSIS — R933 Abnormal findings on diagnostic imaging of other parts of digestive tract: Secondary | ICD-10-CM

## 2018-04-12 DIAGNOSIS — K219 Gastro-esophageal reflux disease without esophagitis: Secondary | ICD-10-CM | POA: Diagnosis not present

## 2018-04-12 LAB — SEDIMENTATION RATE: SED RATE: 75 mm/h — AB (ref 0–30)

## 2018-04-12 LAB — C-REACTIVE PROTEIN: CRP: 2.7 mg/dL (ref 0.5–20.0)

## 2018-04-12 MED ORDER — POLYETHYLENE GLYCOL 3350 17 G PO PACK: 17.0000 g | PACK | Freq: Every day | ORAL | 0 refills | Status: DC

## 2018-04-12 NOTE — Progress Notes (Addendum)
Referring Provider: Velna Hatchet, MD Primary Care Physician:  Velna Hatchet, MD   Reason for Consultation: Abnormal CT   IMPRESSION:  Acute, diffuse abdominal pain, now resolved Ongoing abdominal bloating Abnormal CT scan    - significant stool burden    - 3.8 x 3.6 cm area of mesenteric thickening suspicious for sclerosing mesenteritis Reflux x 20 years    - previously well controlled     - now with ongoing regurgitation and xiphoid pressure despite     - mild reflux on EGD with biopsies daily PPI therapy Dysphonia times years NSAIDS for back pain Prior colonoscopy in 2012 in [place]  Abnormal CT scan with focal mesenteric mass in the abdomen of unclear clinical significance identified during an episode of acute abdominal pain. No systemic complaints. Acute pain has now subsided except for some residual bloating.   CT findings may reflect sclerosing mesenteritis. However, the differential also includes lymphoma, mesenteric carcinomatosis, carcinoid tumor, mesenteric fibromatosis, and mesenteric edema.   PLAN: ESR, CRP, LDH Add Miralax 17 g daily  Trial of prednisone 40 mg daily if abdominal pain recurs Review CT scan with Dr. Ardis Hughs to see if this is amenable to EUS If not, will repeat CT scan in 3-4 months to assess for stability   HPI: Vanessa Bruce is a 58 y.o. 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 Recently seen in consultation for reflux, but, this has been under good control since her endoscopy.   The interval history is obtained through the patient and review of her electronic health record as well as records from Dr. Hoover Brunette office.  Developed severe, non-radiating, constant, 7/10 abdominal pain - intiially midabdomen and then also involved the right abdomen. Associated bloating and disention.  Seen at [facility]. CT scan and labs described below. Abdominal pain has largely resolved.   Persistent bloating. Some distension. Feels full on the right side of her abdomen.  Less eructation and flatus than in the past. Poor appetite. No nausea. Some chills. No fever, night sweats, or rigors. No new arthralgias or myalgias.   Blood and urine have been abnormal and they were retested by Dr. Ardeth Perfect yesterday. She is waiting for the results.  No other associated symptoms. No identified exacerbating or relieving features.   Labs from 04/10/18 show a normal CMP and normal CBC. CT abd/pelvis with contrast 04/10/18 showed focal mesenteric thickening slightly inferior to the third portion of the duodenum and immediately anterior to the aortic bifurcation. Suspect a degree of sclerosing mesenteritis in this area. There is no bowel extending into this area. Note that the wall of the third portion of the duodenum does not appear appreciably thickened.   Going to [place] for the month of April 2020. Wants to have resolution of what's going on before she travel.  Reflux has been under good control on esomeprazole and is not a concern today.    Past Medical History:  Diagnosis Date  . DVT (deep venous thrombosis) ([place])   . GERD (gastroesophageal reflux disease)   . Hypothyroidism     Past Surgical History:  Procedure Laterality Date  . WISDOM TOOTH EXTRACTION      Current Outpatient Medications  Medication Sig Dispense Refill  . esomeprazole (NEXIUM) 40 MG capsule Take 40 mg by mouth daily at 12 noon.    Marland Kitchen levothyroxine (SYNTHROID, LEVOTHROID) 100 MCG tablet Take 100 mcg by mouth daily before breakfast.     No current  facility-administered medications for this visit.     Allergies as of 04/12/2018  . (No Known Allergies)    Family History  Adopted: Yes  Family history unknown: Yes    Social History   Socioeconomic History  . Marital status: Divorced    Spouse name: Not on file  . Number of children: 0  . Years of education: Not on file  . Highest education level: Not on file   Occupational History  . Occupation: self employed  Social Needs  . Financial resource strain: Not on file  . Food insecurity:    Worry: Not on file    Inability: Not on file  . Transportation needs:    Medical: Not on file    Non-medical: Not on file  Tobacco Use  . Smoking status: Former Smoker    Types: Cigarettes    Last attempt to quit: 10/14/1979    Years since quitting: 38.5  . Smokeless tobacco: Never Used  Substance and Sexual Activity  . Alcohol use: No  . Drug use: No  . Sexual activity: Yes    Partners: Male    Comment: 1st intercourse- 25, partners- 2, married- 4 yrs   Lifestyle  . Physical activity:    Days per week: Not on file    Minutes per session: Not on file  . Stress: Not on file  Relationships  . Social connections:    Talks on phone: Not on file    Gets together: Not on file    Attends religious service: Not on file    Active member of club or organization: Not on file    Attends meetings of clubs or organizations: Not on file    Relationship status: Not on file  . Intimate partner violence:    Fear of current or ex partner: Not on file    Emotionally abused: Not on file    Physically abused: Not on file    Forced sexual activity: Not on file  Other Topics Concern  . Not on file  Social History Narrative  . Not on file    Review of Systems: 12 system ROS is negative except as noted above.  Filed Weights   04/12/18 0949  Weight: 171 lb 2 oz (77.6 kg)    Physical Exam: Vital signs were reviewed. General:   Alert, well-nourished, pleasant and cooperative in NAD Head:  Normocephalic and atraumatic. Eyes:  Sclera clear, no icterus.   Conjunctiva pink. Mouth:  No deformity or lesions.   Neck:  Supple; no thyromegaly. Lungs:  Clear throughout to auscultation.   No wheezes. Heart:  Regular rate and rhythm; II/VI SEM Abdomen:  Soft, nontender, normal bowel sounds. No tympany.  No rebound or guarding. No hepatosplenomegaly. No inguinal or  umbilical LAD.  Rectal:  Deferred  Msk:  Symmetrical without gross deformities. Extremities:  No gross deformities or edema. Neurologic:  Alert and  oriented x4;  grossly nonfocal Skin:  No rash or bruise. Psych:  Alert and cooperative. Normal mood and affect.   Rodriguez Aguinaldo L. Tarri Glenn, MD, MPH [facility] 04/12/2018, 10:10 AM

## 2018-04-12 NOTE — Patient Instructions (Addendum)
If you are age 60 or older, your body mass index should be between 23-30. Your Body mass index is 27.1 kg/m. If this is out of the aforementioned range listed, please consider follow up with your Primary Care Provider.  If you are age 27 or younger, your body mass index should be between 19-25. Your Body mass index is 27.1 kg/m. If this is out of the aformentioned range listed, please consider follow up with your Primary Care Provider.   Please go to the lab in the basement of our building to have lab work done as you leave today. Hit "B" for basement when you get on the elevator.  When the doors open the lab is on your left.  We will call you with the results. Thank you.  Please purchase Miralax over-the-counter and use 17 grams, as directed, daily.   Thank you for entrusting me with your care and for choosing [facility], Dr. Thornton Park

## 2018-04-13 ENCOUNTER — Telehealth: Payer: Self-pay

## 2018-04-13 LAB — LACTATE DEHYDROGENASE: LDH: 124 U/L (ref 120–250)

## 2018-04-13 NOTE — Telephone Encounter (Signed)
Patient notified of the recommendations She is aware that I will contact her in May to schedule CT

## 2018-04-13 NOTE — Telephone Encounter (Signed)
-----   Message from Thornton Park, MD sent at 04/12/2018  7:29 PM EST ----- Regarding: Further recommendations Please call the patient. I reviewed her CT images with Dr. Ardis Hughs. He agrees that this is a very vague haziness in the mesentery. It is in a location that is not typically visualized on endoscopic ultrasound. Given his interpretation, I recommend repeating a CT scan in 3 months, sooner if her symptoms relapse. Please schedule. She should contact me with acute abdominal pain or additional concerns in the meantime. Thanks.

## 2018-04-17 ENCOUNTER — Telehealth: Payer: Self-pay | Admitting: Gastroenterology

## 2018-04-17 NOTE — Telephone Encounter (Signed)
Patient states she has a rash that started Saturday in the small of her back and then spread to her chest and has continually spread "everywhere from the neck down". Patient states the only new medication she has tried is Miralax. She has taken benadryl, denies SOB, throat swelling or anaphylactic reaction. Denies any change in hygiene products or laundry detergent. I advised her to make an appointment with her PCP to have someone look at it and determine if she needs any additional medication.  Please advise.

## 2018-04-17 NOTE — Telephone Encounter (Signed)
I am sorry to hear about this. I agree with your recommendations. She should stop the Miralax. She could add a psyllium stool bulking agent in place of the Miralax. Thank you.

## 2018-04-17 NOTE — Telephone Encounter (Signed)
Patient will follow up with PCP and stop Miralax. She will try a daily bulking agent and will try flax seed. Miralax added to allergy list.

## 2018-07-12 ENCOUNTER — Telehealth: Payer: Self-pay

## 2018-07-12 NOTE — Telephone Encounter (Signed)
-----   Message from Marlon Pel, RN sent at 04/13/2018 10:25 AM EST ----- Thornton Park, MD sent to Marlon Pel, RN; Velna Hatchet, MD    Please call the patient. I reviewed her CT images with Dr. Ardis Hughs. He agrees that this is a very vague haziness in the mesentery. It is in a location that is not typically visualized on endoscopic ultrasound. Given his interpretation, I recommend repeating a CT scan in 3 months, sooner if her symptoms relapse. Please schedule. She should contact me with acute abdominal pain or additional concerns in the meantime. Thanks.

## 2018-07-12 NOTE — Telephone Encounter (Signed)
Left message for patient to call back  

## 2018-07-13 NOTE — Telephone Encounter (Signed)
Left message for patient to call back  

## 2018-07-17 NOTE — Telephone Encounter (Signed)
Patient contacted about setting up the repeat CT scan from February.  She declines for now.  She wants to hold off for now.  She has very infrequent pain and wants to deger scan at this time due to cost. She understands to call back if her symptoms return or she needs any GI care.  She thanked me for the call.

## 2018-07-17 NOTE — Telephone Encounter (Signed)
Thank you. I hope that she will reconsider given the unclear findings on her CT scan.

## 2018-08-25 ENCOUNTER — Other Ambulatory Visit: Payer: Self-pay | Admitting: Obstetrics & Gynecology

## 2018-08-25 DIAGNOSIS — Z1231 Encounter for screening mammogram for malignant neoplasm of breast: Secondary | ICD-10-CM

## 2018-09-25 ENCOUNTER — Other Ambulatory Visit: Payer: Self-pay

## 2018-09-25 ENCOUNTER — Ambulatory Visit
Admission: RE | Admit: 2018-09-25 | Discharge: 2018-09-25 | Disposition: A | Discharge status: Home / Self Care | Payer: BC Managed Care – PPO | Source: Ambulatory Visit | Attending: Obstetrics & Gynecology | Admitting: Obstetrics & Gynecology

## 2018-09-25 DIAGNOSIS — Z1231 Encounter for screening mammogram for malignant neoplasm of breast: Secondary | ICD-10-CM

## 2018-10-18 ENCOUNTER — Other Ambulatory Visit: Payer: Self-pay

## 2018-10-19 ENCOUNTER — Encounter: Payer: Self-pay | Admitting: Obstetrics & Gynecology

## 2018-10-19 ENCOUNTER — Ambulatory Visit: Payer: BC Managed Care – PPO | Admitting: Obstetrics & Gynecology

## 2018-10-19 VITALS — BP 126/82 | Ht 67.25 in | Wt 166.0 lb

## 2018-10-19 DIAGNOSIS — Z01419 Encounter for gynecological examination (general) (routine) without abnormal findings: Secondary | ICD-10-CM

## 2018-10-19 DIAGNOSIS — Z780 Asymptomatic menopausal state: Secondary | ICD-10-CM | POA: Diagnosis not present

## 2018-10-19 DIAGNOSIS — M8589 Other specified disorders of bone density and structure, multiple sites: Secondary | ICD-10-CM

## 2018-10-19 NOTE — Patient Instructions (Signed)
1. Well female exam with routine gynecological exam Normal gynecologic exam.  Pap test August 2019 was negative, no indication to repeat this year.  Breast exam normal.  Screening mammogram July 2020 was negative.  Good body mass index at 25.81.  Continue with fitness and healthy nutrition.  Health labs with family physician.  Colonoscopy in 2012, on a 10-year basis.  2. Postmenopause Well on no hormone replacement therapy.  No postmenopausal bleeding.  Sexually active, doing well with lubricant.  Will try coconut oil.  3. Osteopenia of multiple sites Recent bone density through her family physician showed osteopenia at the spine.  Continue with vitamin D supplements, calcium intake of 1200 mg daily and regular weightbearing physical activities.  Vanessa Bruce, it was a pleasure seeing you today!

## 2018-10-19 NOTE — Progress Notes (Signed)
Darrylin Devalk 1960-08-26 [id number]   History:    58 y.o. G0 Married (English man).  Patient's mother passed away last year at 48+ yo.  Was planning a long camper trip with her [place], still in the plans.  RP:  Established patient presenting for annual gyn exam   HPI: Menopause, well on no HRT.  No PMB.  No pelvic pain.  Improved libido, no pain with intercourse using lubricant, [place].  Urine/BMs wnl.  Breasts wnl.  BMI 25.81.  Physically active, hiking and camper camping.  Health labs with Fam MD.  Colono 2012.  Bone density Osteopenia.     Past medical history,surgical history, family history and social history were all reviewed and documented in the EPIC chart.  Gynecologic History No LMP recorded. Patient is postmenopausal. Contraception: post menopausal status Last Pap: 09/2017. Results were: Negative Last mammogram: 08/2018. Results were: Negative Bone Density: 2020 Osteopenia at Spine.  Fam MD. Colonoscopy: 2012  Obstetric History OB History  Gravida Para Term Preterm AB Living  0 0 0 0 0 0  SAB TAB Ectopic Multiple Live Births  0 0 0 0 0     ROS: A ROS was performed and pertinent positives and negatives are included in the history.  GENERAL: No fevers or chills. HEENT: No change in vision, no earache, sore throat or sinus congestion. NECK: No pain or stiffness. CARDIOVASCULAR: No chest pain or pressure. No palpitations. PULMONARY: No shortness of breath, cough or wheeze. GASTROINTESTINAL: No abdominal pain, nausea, vomiting or diarrhea, melena or bright red blood per rectum. GENITOURINARY: No urinary frequency, urgency, hesitancy or dysuria. MUSCULOSKELETAL: No joint or muscle pain, no back pain, no recent trauma. DERMATOLOGIC: No rash, no itching, no lesions. ENDOCRINE: No polyuria, polydipsia, no heat or cold intolerance. No recent change in weight. HEMATOLOGICAL: No anemia or easy bruising or bleeding. NEUROLOGIC: No headache,  seizures, numbness, tingling or weakness. PSYCHIATRIC: No depression, no loss of interest in normal activity or change in sleep pattern.     Exam:   BP 126/82   Ht 5' 7.25" (1.708 m)   Wt 166 lb (75.3 kg)   BMI 25.81 kg/m   Body mass index is 25.81 kg/m.  General appearance : Well developed well nourished female. No acute distress HEENT: Eyes: no retinal hemorrhage or exudates,  Neck supple, trachea midline, no carotid bruits, no thyroidmegaly Lungs: Clear to auscultation, no rhonchi or wheezes, or rib retractions  Heart: Regular rate and rhythm, no murmurs or gallops Breast:Examined in sitting and supine position were symmetrical in appearance, no palpable masses or tenderness,  no skin retraction, no nipple inversion, no nipple discharge, no skin discoloration, no axillary or supraclavicular lymphadenopathy Abdomen: no palpable masses or tenderness, no rebound or guarding Extremities: no edema or skin discoloration or tenderness  Pelvic: Vulva: Normal             Vagina: No gross lesions or discharge  Cervix: No gross lesions or discharge  Uterus  AV, normal size, shape and consistency, non-tender and mobile  Adnexa  Without masses or tenderness  Anus: Normal   Assessment/Plan:  58 y.o. female for annual exam   1. Well female exam with routine gynecological exam Normal gynecologic exam.  Pap test August 2019 was negative, no indication to repeat this year.  Breast exam normal.  Screening mammogram July 2020 was negative.  Good body mass index at 25.81.  Continue with fitness and healthy nutrition.  Health labs with family physician.  Colonoscopy in 2012, on a 10-year basis.  2. Postmenopause Well on no hormone replacement therapy.  No postmenopausal bleeding.  Sexually active, doing well with lubricant.  Will try coconut oil.  3. Osteopenia of multiple sites Recent bone density through her family physician showed osteopenia at the spine.  Continue with vitamin D supplements,  calcium intake of 1200 mg daily and regular weightbearing physical activities.  Princess Bruins MD, 11:17 AM 10/19/2018

## 2018-11-24 DIAGNOSIS — Z23 Encounter for immunization: Secondary | ICD-10-CM | POA: Diagnosis not present

## 2019-01-23 DIAGNOSIS — E038 Other specified hypothyroidism: Secondary | ICD-10-CM | POA: Diagnosis not present

## 2019-01-23 DIAGNOSIS — E7849 Other hyperlipidemia: Secondary | ICD-10-CM | POA: Diagnosis not present

## 2019-01-23 DIAGNOSIS — M859 Disorder of bone density and structure, unspecified: Secondary | ICD-10-CM | POA: Diagnosis not present

## 2019-01-23 DIAGNOSIS — Z0000 Encounter for general adult medical examination without abnormal findings: Secondary | ICD-10-CM | POA: Diagnosis not present

## 2019-01-30 DIAGNOSIS — R700 Elevated erythrocyte sedimentation rate: Secondary | ICD-10-CM | POA: Diagnosis not present

## 2019-01-30 DIAGNOSIS — R7982 Elevated C-reactive protein (CRP): Secondary | ICD-10-CM | POA: Diagnosis not present

## 2019-01-30 DIAGNOSIS — R030 Elevated blood-pressure reading, without diagnosis of hypertension: Secondary | ICD-10-CM | POA: Diagnosis not present

## 2019-01-30 DIAGNOSIS — Z1331 Encounter for screening for depression: Secondary | ICD-10-CM | POA: Diagnosis not present

## 2019-01-30 DIAGNOSIS — K654 Sclerosing mesenteritis: Secondary | ICD-10-CM | POA: Diagnosis not present

## 2019-01-30 DIAGNOSIS — Z0000 Encounter for general adult medical examination without abnormal findings: Secondary | ICD-10-CM | POA: Diagnosis not present

## 2019-01-30 DIAGNOSIS — E039 Hypothyroidism, unspecified: Secondary | ICD-10-CM | POA: Diagnosis not present

## 2019-02-01 DIAGNOSIS — R7982 Elevated C-reactive protein (CRP): Secondary | ICD-10-CM | POA: Diagnosis not present

## 2019-02-01 DIAGNOSIS — R700 Elevated erythrocyte sedimentation rate: Secondary | ICD-10-CM | POA: Diagnosis not present

## 2019-04-12 DIAGNOSIS — R1031 Right lower quadrant pain: Secondary | ICD-10-CM | POA: Diagnosis not present

## 2019-04-12 DIAGNOSIS — D649 Anemia, unspecified: Secondary | ICD-10-CM | POA: Diagnosis not present

## 2019-05-18 ENCOUNTER — Ambulatory Visit: Payer: BC Managed Care – PPO | Attending: Internal Medicine

## 2019-05-18 DIAGNOSIS — Z23 Encounter for immunization: Secondary | ICD-10-CM

## 2019-05-18 NOTE — Progress Notes (Signed)
   Covid-19 Vaccination Clinic  Name:  Vanessa Bruce    MRN: [id number] DOB: March 18, 1960  05/18/2019  [name] was observed post Covid-19 immunization for 15 minutes without incident. She was provided with Vaccine Information Sheet and instruction to access the V-Safe system.   [name] was instructed to call 911 with any severe reactions post vaccine: Marland Kitchen Difficulty breathing  . Swelling of face and throat  . A fast heartbeat  . A bad rash all over body  . Dizziness and weakness   Immunizations Administered    Name Date Dose VIS Date Route   Moderna COVID-19 Vaccine 05/18/2019 10:53 AM 0.5 mL 01/30/2019 Intramuscular   Manufacturer: Moderna   Lot: [id number]   [id number]

## 2019-06-19 ENCOUNTER — Ambulatory Visit: Payer: BC Managed Care – PPO | Attending: Internal Medicine

## 2019-06-19 DIAGNOSIS — Z23 Encounter for immunization: Secondary | ICD-10-CM

## 2019-06-19 NOTE — Progress Notes (Signed)
   Covid-19 Vaccination Clinic  Name:  Vanessa Bruce    MRN: [id number] DOB: 04-30-1960  06/19/2019  [name] was observed post Covid-19 immunization for 15 minutes without incident. She was provided with Vaccine Information Sheet and instruction to access the V-Safe system.   [name] was instructed to call 911 with any severe reactions post vaccine: Marland Kitchen Difficulty breathing  . Swelling of face and throat  . A fast heartbeat  . A bad rash all over body  . Dizziness and weakness   Immunizations Administered    Name Date Dose VIS Date Route   Moderna COVID-19 Vaccine 06/19/2019 10:30 AM 0.5 mL 01/2019 Intramuscular   Manufacturer: Moderna   Lot: [id number]   [id number]

## 2019-07-26 DIAGNOSIS — H52223 Regular astigmatism, bilateral: Secondary | ICD-10-CM | POA: Diagnosis not present

## 2019-07-26 DIAGNOSIS — H2513 Age-related nuclear cataract, bilateral: Secondary | ICD-10-CM | POA: Diagnosis not present

## 2019-08-22 ENCOUNTER — Other Ambulatory Visit: Payer: Self-pay | Admitting: Obstetrics & Gynecology

## 2019-08-22 DIAGNOSIS — Z1231 Encounter for screening mammogram for malignant neoplasm of breast: Secondary | ICD-10-CM

## 2019-10-08 ENCOUNTER — Ambulatory Visit: Payer: BC Managed Care – PPO

## 2019-10-22 ENCOUNTER — Encounter: Payer: BC Managed Care – PPO | Admitting: Obstetrics & Gynecology

## 2019-10-24 ENCOUNTER — Ambulatory Visit
Admission: RE | Admit: 2019-10-24 | Discharge: 2019-10-24 | Disposition: A | Discharge status: Home / Self Care | Payer: BC Managed Care – PPO | Source: Ambulatory Visit | Attending: Obstetrics & Gynecology | Admitting: Obstetrics & Gynecology

## 2019-10-24 ENCOUNTER — Other Ambulatory Visit: Payer: Self-pay

## 2019-10-24 DIAGNOSIS — Z1231 Encounter for screening mammogram for malignant neoplasm of breast: Secondary | ICD-10-CM | POA: Diagnosis not present

## 2019-10-25 ENCOUNTER — Other Ambulatory Visit: Payer: Self-pay | Admitting: Obstetrics & Gynecology

## 2019-10-25 DIAGNOSIS — R928 Other abnormal and inconclusive findings on diagnostic imaging of breast: Secondary | ICD-10-CM

## 2019-11-02 ENCOUNTER — Ambulatory Visit
Admission: RE | Admit: 2019-11-02 | Discharge: 2019-11-02 | Disposition: A | Discharge status: Home / Self Care | Payer: BC Managed Care – PPO | Source: Ambulatory Visit | Attending: Obstetrics & Gynecology | Admitting: Obstetrics & Gynecology

## 2019-11-02 ENCOUNTER — Ambulatory Visit: Payer: BC Managed Care – PPO

## 2019-11-02 DIAGNOSIS — R928 Other abnormal and inconclusive findings on diagnostic imaging of breast: Secondary | ICD-10-CM

## 2019-11-08 ENCOUNTER — Other Ambulatory Visit: Payer: Self-pay

## 2019-11-08 ENCOUNTER — Ambulatory Visit (INDEPENDENT_AMBULATORY_CARE_PROVIDER_SITE_OTHER): Payer: BC Managed Care – PPO | Admitting: Obstetrics & Gynecology

## 2019-11-08 ENCOUNTER — Encounter: Payer: Self-pay | Admitting: Obstetrics & Gynecology

## 2019-11-08 VITALS — BP 130/80 | Ht 66.5 in | Wt 166.0 lb

## 2019-11-08 DIAGNOSIS — M8589 Other specified disorders of bone density and structure, multiple sites: Secondary | ICD-10-CM

## 2019-11-08 DIAGNOSIS — Z01419 Encounter for gynecological examination (general) (routine) without abnormal findings: Secondary | ICD-10-CM

## 2019-11-08 DIAGNOSIS — Z780 Asymptomatic menopausal state: Secondary | ICD-10-CM | POA: Diagnosis not present

## 2019-11-08 NOTE — Progress Notes (Signed)
Vanessa Bruce 14-Sep-1960 [id number]   History:    59 y.o. G0 Married (English man). Patient's mother passed away 2 years ago at 66+ yo.  Started a long camper trip with her [place].  RP: Established patient presenting for annual gyn exam   HPI: Menopause, well on no HRT. No PMB. No pelvic pain. Improved libido, no pain with intercourse using lubricant, [place]. Urine/BMs wnl. Breasts wnl. BMI 26.39. Physically active, hiking and camper camping.Health labs with Fam MD. Colono 2012. Bone density Osteopenia in 2019.    Past medical history,surgical history, family history and social history were all reviewed and documented in the EPIC chart.  Gynecologic History No LMP recorded. Patient is postmenopausal.  Obstetric History OB History  Gravida Para Term Preterm AB Living  0 0 0 0 0 0  SAB TAB Ectopic Multiple Live Births  0 0 0 0 0     ROS: A ROS was performed and pertinent positives and negatives are included in the history.  GENERAL: No fevers or chills. HEENT: No change in vision, no earache, sore throat or sinus congestion. NECK: No pain or stiffness. CARDIOVASCULAR: No chest pain or pressure. No palpitations. PULMONARY: No shortness of breath, cough or wheeze. GASTROINTESTINAL: No abdominal pain, nausea, vomiting or diarrhea, melena or bright red blood per rectum. GENITOURINARY: No urinary frequency, urgency, hesitancy or dysuria. MUSCULOSKELETAL: No joint or muscle pain, no back pain, no recent trauma. DERMATOLOGIC: No rash, no itching, no lesions. ENDOCRINE: No polyuria, polydipsia, no heat or cold intolerance. No recent change in weight. HEMATOLOGICAL: No anemia or easy bruising or bleeding. NEUROLOGIC: No headache, seizures, numbness, tingling or weakness. PSYCHIATRIC: No depression, no loss of interest in normal activity or change in sleep pattern.     Exam:   BP 130/80   Ht 5' 6.5" (1.689 m)   Wt 166 lb (75.3 kg)   BMI 26.39 kg/m     Body mass index is 26.39 kg/m.  General appearance : Well developed well nourished female. No acute distress HEENT: Eyes: no retinal hemorrhage or exudates,  Neck supple, trachea midline, no carotid bruits, no thyroidmegaly Lungs: Clear to auscultation, no rhonchi or wheezes, or rib retractions  Heart: Regular rate and rhythm, no murmurs or gallops Breast:Examined in sitting and supine position were symmetrical in appearance, no palpable masses or tenderness,  no skin retraction, no nipple inversion, no nipple discharge, no skin discoloration, no axillary or supraclavicular lymphadenopathy Abdomen: no palpable masses or tenderness, no rebound or guarding Extremities: no edema or skin discoloration or tenderness  Pelvic: Vulva: Normal             Vagina: No gross lesions or discharge  Cervix: No gross lesions or discharge  Uterus  AV, normal size, shape and consistency, non-tender and mobile  Adnexa  Without masses or tenderness  Anus: Normal   Assessment/Plan:  59 y.o. female for annual exam   1. Well female exam with routine gynecological exam Normal gynecologic exam in menopause.  Pap test negative in August 2019, will repeat next year.  Breast exam normal.  Screening mammogram of the left breast was negative in August 2021, right diagnostic mammogram negative in September 2021.  Colonoscopy 2012.  Health labs with family physician.  2. Postmenopause Well on no hormone replacement therapy.  No postmenopausal bleeding.  3. Osteopenia of multiple sites Osteopenia on bone density in 2019.  We will repeat a bone density through her family physician.  Vitamin  D supplements, calcium intake of 1500 mg daily and regular weightbearing physical activities to continue.  Princess Bruins MD, 2:04 PM 11/08/2019

## 2019-12-01 DIAGNOSIS — Z23 Encounter for immunization: Secondary | ICD-10-CM | POA: Diagnosis not present

## 2019-12-24 DIAGNOSIS — M47817 Spondylosis without myelopathy or radiculopathy, lumbosacral region: Secondary | ICD-10-CM | POA: Diagnosis not present

## 2019-12-24 DIAGNOSIS — M545 Low back pain, unspecified: Secondary | ICD-10-CM | POA: Diagnosis not present

## 2020-01-02 DIAGNOSIS — M545 Low back pain, unspecified: Secondary | ICD-10-CM | POA: Diagnosis not present

## 2020-01-07 DIAGNOSIS — M545 Low back pain, unspecified: Secondary | ICD-10-CM | POA: Diagnosis not present

## 2020-01-10 DIAGNOSIS — M545 Low back pain, unspecified: Secondary | ICD-10-CM | POA: Diagnosis not present

## 2020-01-14 DIAGNOSIS — M545 Low back pain, unspecified: Secondary | ICD-10-CM | POA: Diagnosis not present

## 2020-01-28 DIAGNOSIS — M545 Low back pain, unspecified: Secondary | ICD-10-CM | POA: Diagnosis not present

## 2020-02-06 DIAGNOSIS — E039 Hypothyroidism, unspecified: Secondary | ICD-10-CM | POA: Diagnosis not present

## 2020-02-06 DIAGNOSIS — E785 Hyperlipidemia, unspecified: Secondary | ICD-10-CM | POA: Diagnosis not present

## 2020-02-06 DIAGNOSIS — Z0000 Encounter for general adult medical examination without abnormal findings: Secondary | ICD-10-CM | POA: Diagnosis not present

## 2020-02-14 DIAGNOSIS — Z0000 Encounter for general adult medical examination without abnormal findings: Secondary | ICD-10-CM | POA: Diagnosis not present

## 2020-02-14 DIAGNOSIS — R82998 Other abnormal findings in urine: Secondary | ICD-10-CM | POA: Diagnosis not present

## 2020-02-14 DIAGNOSIS — D649 Anemia, unspecified: Secondary | ICD-10-CM | POA: Diagnosis not present

## 2020-02-15 DIAGNOSIS — M545 Low back pain, unspecified: Secondary | ICD-10-CM | POA: Diagnosis not present

## 2020-04-03 DIAGNOSIS — M5126 Other intervertebral disc displacement, lumbar region: Secondary | ICD-10-CM | POA: Diagnosis not present

## 2020-10-01 ENCOUNTER — Other Ambulatory Visit: Payer: Self-pay | Admitting: Obstetrics & Gynecology

## 2020-10-01 DIAGNOSIS — Z1231 Encounter for screening mammogram for malignant neoplasm of breast: Secondary | ICD-10-CM

## 2020-11-11 ENCOUNTER — Encounter: Payer: Self-pay | Admitting: Obstetrics & Gynecology

## 2020-11-11 ENCOUNTER — Ambulatory Visit (INDEPENDENT_AMBULATORY_CARE_PROVIDER_SITE_OTHER): Payer: BC Managed Care – PPO | Admitting: Obstetrics & Gynecology

## 2020-11-11 ENCOUNTER — Other Ambulatory Visit (HOSPITAL_COMMUNITY)
Admission: RE | Admit: 2020-11-11 | Discharge: 2020-11-11 | Disposition: A | Discharge status: Home / Self Care | Payer: BC Managed Care – PPO | Source: Ambulatory Visit | Attending: Obstetrics & Gynecology | Admitting: Obstetrics & Gynecology

## 2020-11-11 ENCOUNTER — Other Ambulatory Visit: Payer: Self-pay

## 2020-11-11 VITALS — BP 122/80 | HR 65 | Resp 16 | Ht 66.75 in | Wt 170.0 lb

## 2020-11-11 DIAGNOSIS — Z01419 Encounter for gynecological examination (general) (routine) without abnormal findings: Secondary | ICD-10-CM

## 2020-11-11 DIAGNOSIS — M8589 Other specified disorders of bone density and structure, multiple sites: Secondary | ICD-10-CM | POA: Diagnosis not present

## 2020-11-11 DIAGNOSIS — Z780 Asymptomatic menopausal state: Secondary | ICD-10-CM

## 2020-11-11 NOTE — Progress Notes (Signed)
Vanessa Bruce December 16, 1960 [id number]   History:    60 y.o. G0 Married (English man).  Patient's mother passed away 2 years ago at 63+ yo.  Started a long camper trip with her husband [place]   RP:  Established patient presenting for annual gyn exam    HPI: Postmenopause, well on no HRT.  No PMB.  No pelvic pain.  Improved libido, no pain with intercourse using coconut oil and collagen.  Urine/BMs wnl.  Breasts wnl.  BMI 26.83.  Physically active, hiking and camper camping.  Health labs with Fam MD.  Vanessa Bruce, will schedule now.  Bone density Osteopenia stable in 2022.      Past medical history,surgical history, family history and social history were all reviewed and documented in the EPIC chart.  Gynecologic History No LMP recorded. Patient is postmenopausal.  Obstetric History OB History  Gravida Para Term Preterm AB Living  0 0 0 0 0 0  SAB IAB Ectopic Multiple Live Births  0 0 0 0 0     ROS: A ROS was performed and pertinent positives and negatives are included in the history.  GENERAL: No fevers or chills. HEENT: No change in vision, no earache, sore throat or sinus congestion. NECK: No pain or stiffness. CARDIOVASCULAR: No chest pain or pressure. No palpitations. PULMONARY: No shortness of breath, cough or wheeze. GASTROINTESTINAL: No abdominal pain, nausea, vomiting or diarrhea, melena or bright red blood per rectum. GENITOURINARY: No urinary frequency, urgency, hesitancy or dysuria. MUSCULOSKELETAL: No joint or muscle pain, no back pain, no recent trauma. DERMATOLOGIC: No rash, no itching, no lesions. ENDOCRINE: No polyuria, polydipsia, no heat or cold intolerance. No recent change in weight. HEMATOLOGICAL: No anemia or easy bruising or bleeding. NEUROLOGIC: No headache, seizures, numbness, tingling or weakness. PSYCHIATRIC: No depression, no loss of interest in normal activity or change in sleep pattern.     Exam:   BP 122/80   Pulse 65   Resp 16    Ht 5' 6.75" (1.695 m)   Wt 170 lb (77.1 kg)   BMI 26.83 kg/m   Body mass index is 26.83 kg/m.  General appearance : Well developed well nourished female. No acute distress HEENT: Eyes: no retinal hemorrhage or exudates,  Neck supple, trachea midline, no carotid bruits, no thyroidmegaly Lungs: Clear to auscultation, no rhonchi or wheezes, or rib retractions  Heart: Regular rate and rhythm, no murmurs or gallops Breast:Examined in sitting and supine position were symmetrical in appearance, no palpable masses or tenderness,  no skin retraction, no nipple inversion, no nipple discharge, no skin discoloration, no axillary or supraclavicular lymphadenopathy Abdomen: no palpable masses or tenderness, no rebound or guarding Extremities: no edema or skin discoloration or tenderness  Pelvic: Vulva: Normal             Vagina: No gross lesions or discharge  Cervix: No gross lesions or discharge.  Pap reflex done.  Uterus  AV, normal size, shape and consistency, non-tender and mobile  Adnexa  Without masses or tenderness  Anus: Normal   Assessment/Plan:  60 y.o. female for annual exam   1. Encounter for routine gynecological examination with Papanicolaou smear of cervix Normal gynecologic exam.  Pap reflex done.  Breast exam normal.  Will schedule a screening mammogram now.  Colonoscopy 2012, needs a repeat this year.  Health labs with family physician.  Good body mass index at 26.83.  Continue with fitness and healthy nutrition. - Cytology -  PAP( [place])  2. Postmenopause Well on no hormone replacement therapy.  No postmenopausal bleeding.  3. Osteopenia of multiple sites  Osteopenia on bone density in 2022.  Vitamin D supplements, calcium intake of 1.5 g/day total and regular weightbearing physical activities recommended.  Princess Bruins MD, 10:20 AM 11/11/2020

## 2020-11-15 ENCOUNTER — Encounter: Payer: Self-pay | Admitting: Obstetrics & Gynecology

## 2020-11-17 LAB — CYTOLOGY - PAP: Diagnosis: NEGATIVE

## 2020-11-21 ENCOUNTER — Ambulatory Visit: Payer: BC Managed Care – PPO

## 2020-12-12 ENCOUNTER — Other Ambulatory Visit: Payer: Self-pay

## 2020-12-12 ENCOUNTER — Ambulatory Visit
Admission: RE | Admit: 2020-12-12 | Discharge: 2020-12-12 | Disposition: A | Discharge status: Home / Self Care | Payer: BC Managed Care – PPO | Source: Ambulatory Visit | Attending: Obstetrics & Gynecology | Admitting: Obstetrics & Gynecology

## 2020-12-12 DIAGNOSIS — Z1231 Encounter for screening mammogram for malignant neoplasm of breast: Secondary | ICD-10-CM | POA: Diagnosis not present

## 2021-03-13 DIAGNOSIS — M859 Disorder of bone density and structure, unspecified: Secondary | ICD-10-CM | POA: Diagnosis not present

## 2021-03-13 DIAGNOSIS — E785 Hyperlipidemia, unspecified: Secondary | ICD-10-CM | POA: Diagnosis not present

## 2021-03-13 DIAGNOSIS — E039 Hypothyroidism, unspecified: Secondary | ICD-10-CM | POA: Diagnosis not present

## 2021-03-20 DIAGNOSIS — Z1331 Encounter for screening for depression: Secondary | ICD-10-CM | POA: Diagnosis not present

## 2021-03-20 DIAGNOSIS — Z1339 Encounter for screening examination for other mental health and behavioral disorders: Secondary | ICD-10-CM | POA: Diagnosis not present

## 2021-03-20 DIAGNOSIS — Z0000 Encounter for general adult medical examination without abnormal findings: Secondary | ICD-10-CM | POA: Diagnosis not present

## 2021-03-20 DIAGNOSIS — D649 Anemia, unspecified: Secondary | ICD-10-CM | POA: Diagnosis not present

## 2021-04-24 ENCOUNTER — Encounter: Payer: Self-pay | Admitting: Gastroenterology

## 2021-05-11 ENCOUNTER — Other Ambulatory Visit: Payer: Self-pay

## 2021-05-11 ENCOUNTER — Ambulatory Visit (AMBULATORY_SURGERY_CENTER): Payer: BC Managed Care – PPO | Admitting: *Deleted

## 2021-05-11 VITALS — Ht 67.0 in | Wt 170.0 lb

## 2021-05-11 DIAGNOSIS — Z1211 Encounter for screening for malignant neoplasm of colon: Secondary | ICD-10-CM

## 2021-05-11 MED ORDER — NA SULFATE-K SULFATE-MG SULF 17.5-3.13-1.6 GM/177ML PO SOLN: 2.0000 | Freq: Once | ORAL | 0 refills | Status: AC

## 2021-05-11 NOTE — Progress Notes (Signed)

## 2021-05-29 ENCOUNTER — Encounter: Payer: Self-pay | Admitting: Gastroenterology

## 2021-06-01 ENCOUNTER — Encounter: Payer: Self-pay | Admitting: Gastroenterology

## 2021-06-01 ENCOUNTER — Ambulatory Visit (AMBULATORY_SURGERY_CENTER): Payer: BC Managed Care – PPO | Admitting: Gastroenterology

## 2021-06-01 VITALS — BP 128/73 | HR 46 | Temp 97.1°F | Resp 14 | Ht 66.0 in | Wt 170.0 lb

## 2021-06-01 DIAGNOSIS — D123 Benign neoplasm of transverse colon: Secondary | ICD-10-CM

## 2021-06-01 DIAGNOSIS — Z1211 Encounter for screening for malignant neoplasm of colon: Secondary | ICD-10-CM

## 2021-06-01 DIAGNOSIS — D124 Benign neoplasm of descending colon: Secondary | ICD-10-CM

## 2021-06-01 MED ORDER — SODIUM CHLORIDE 0.9 % IV SOLN: 500.0000 mL | Freq: Once | INTRAVENOUS | Status: DC

## 2021-06-01 NOTE — Patient Instructions (Signed)
Impression/Recommendations: ? ?Polyp and hemorrhoid handouts given to patient. ? ?Resume previous diet. ?Continue present medications. ?Await pathology results. ? ?Repeat colonoscopy for surveillance.  Date to be determined after pathology results reviewed. ? ?YOU HAD AN ENDOSCOPIC PROCEDURE TODAY AT [facility]:   Refer to the procedure report that was given to you for any specific questions about what was found during the examination.  If the procedure report does not answer your questions, please call your gastroenterologist to clarify.  If you requested that your care partner not be given the details of your procedure findings, then the procedure report has been included in a sealed envelope for you to review at your convenience later. ? ?YOU SHOULD EXPECT: Some feelings of bloating in the abdomen. Passage of more gas than usual.  Walking can help get rid of the air that was put into your GI tract during the procedure and reduce the bloating. If you had a lower endoscopy (such as a colonoscopy or flexible sigmoidoscopy) you may notice spotting of blood in your stool or on the toilet paper. If you underwent a bowel prep for your procedure, you may not have a normal bowel movement for a few days. ? ?Please Note:  You might notice some irritation and congestion in your nose or some drainage.  This is from the oxygen used during your procedure.  There is no need for concern and it should clear up in a day or so. ? ?SYMPTOMS TO REPORT IMMEDIATELY: ? ?Following lower endoscopy (colonoscopy or flexible sigmoidoscopy): ? Excessive amounts of blood in the stool ? Significant tenderness or worsening of abdominal pains ? Swelling of the abdomen that is new, acute ? Fever of 100?F or higher ? ?For urgent or emergent issues, a gastroenterologist can be reached at any hour by calling [phone]. ?Do not use MyChart messaging for urgent concerns.  ? ? ?DIET:  We do recommend a small meal at first, but then  you may proceed to your regular diet.  Drink plenty of fluids but you should avoid alcoholic beverages for 24 hours. ? ?ACTIVITY:  You should plan to take it easy for the rest of today and you should NOT DRIVE or use heavy machinery until tomorrow (because of the sedation medicines used during the test).   ? ?FOLLOW UP: ?Our staff will call the number listed on your records 48-72 hours following your procedure to check on you and address any questions or concerns that you may have regarding the information given to you following your procedure. If we do not reach you, we will leave a message.  We will attempt to reach you two times.  During this call, we will ask if you have developed any symptoms of COVID 19. If you develop any symptoms (ie: fever, flu-like symptoms, shortness of breath, cough etc.) before then, please call [phone].  If you test positive for Covid 19 in the 2 weeks post procedure, please call and report this information to [place].   ? ?If any biopsies were taken you will be contacted by phone or by letter within the next 1-3 weeks.  Please call us at [phone] if you have not heard about the biopsies in 3 weeks.  ? ? ?SIGNATURES/CONFIDENTIALITY: ?You and/or your care partner have signed paperwork which will be entered into your electronic medical record.  These signatures attest to the fact that that the information above on your After Visit Summary has been reviewed and is understood.  Full  responsibility of the confidentiality of this discharge information lies with you and/or your care-partner.  ?

## 2021-06-01 NOTE — Progress Notes (Signed)
Pt's states no medical or surgical changes since previsit or office visit. VS by DT. °

## 2021-06-01 NOTE — Progress Notes (Signed)
? ?  Referring Provider: Velna Hatchet, MD ?Primary Care Physician:  Velna Hatchet, MD ? ?Indication for Procedure:  Colon cancer Surveillance ? ? ?IMPRESSION:  ?Need for colon cancer surveillance ?Appropriate candidate for monitored anesthesia care ? ?PLAN: ?Colonoscopy in the [place] today ? ? ?HPI: Vanessa Bruce is a 61 y.o. female presents for surveillance colonoscopy. ? ?Prior endoscopic history: ?Prior colonoscopy in 2012 in [place] ? ?No baseline GI symptoms.  ? ?No known family history of colon cancer or polyps. No family history of uterine/endometrial cancer, pancreatic cancer or gastric/stomach cancer. ? ? ?Past Medical History:  ?Diagnosis Date  ? DVT (deep venous thrombosis) ([place]) 2009  ? GERD (gastroesophageal reflux disease)   ? Hypothyroidism   ? ? ?Past Surgical History:  ?Procedure Laterality Date  ? WISDOM TOOTH EXTRACTION    ? ? ?Current Outpatient Medications  ?Medication Sig Dispense Refill  ? Ascorbic Acid (VITAMIN C) 500 MG/5ML LIQD Take 1 capsule by mouth daily.    ? Cholecalciferol (VITAMIN D3) 100000 UNIT/GM POWD Take 1 capsule by mouth daily.    ? esomeprazole (NEXIUM) 40 MG capsule Take 40 mg by mouth daily at 12 noon.    ? Flaxseed, Linseed, (FLAX SEED OIL) 1000 MG CAPS Take 1 capsule by mouth daily.    ? levothyroxine (SYNTHROID, LEVOTHROID) 100 MCG tablet Take 100 mcg by mouth daily before breakfast.    ? Omega-3 Fatty Acids (FISH OIL) 1200 MG CAPS Take 1 capsule by mouth daily.    ? metroNIDAZOLE (METROCREAM) 0.75 % cream Apply as needed to affected area    ? ?Current Facility-Administered Medications  ?Medication Dose Route Frequency Provider Last Rate Last Admin  ? 0.9 %  sodium chloride infusion  500 mL Intravenous Once Thornton Park, MD      ? ? ?Allergies as of 06/01/2021 - Review Complete 06/01/2021  ?Allergen Reaction Noted  ? Miralax [polyethylene glycol 3350] Hives 04/17/2018  ? ? ?Family History  ?Adopted: Yes  ?Problem Relation Age of Onset  ? Colon cancer Neg Hx    ? Colon polyps Neg Hx   ? Esophageal cancer Neg Hx   ? Rectal cancer Neg Hx   ? Prostate cancer Neg Hx   ? ? ? ?Physical Exam: ?General:   Alert,  well-nourished, pleasant and cooperative in NAD ?Head:  Normocephalic and atraumatic. ?Eyes:  Sclera clear, no icterus.   Conjunctiva pink. ?Mouth:  No deformity or lesions.   ?Neck:  Supple; no masses or thyromegaly. ?Lungs:  Clear throughout to auscultation.   No wheezes. ?Heart:  Regular rate and rhythm; no murmurs. ?Abdomen:  Soft, non-tender, nondistended, normal bowel sounds, no rebound or guarding.  ?Msk:  Symmetrical. No boney deformities ?LAD: No inguinal or umbilical LAD ?Extremities:  No clubbing or edema. ?Neurologic:  Alert and  oriented x4;  grossly nonfocal ?Skin:  No obvious rash or bruise. ?Psych:  Alert and cooperative. Normal mood and affect. ? ? ? ? ?Studies/Results: ?No results found. ? ? ? ?Tessia Kassin L. Tarri Glenn, MD, MPH ?06/01/2021, 1:19 PM ? ? ?  ?

## 2021-06-01 NOTE — Progress Notes (Signed)
Called to room to assist during endoscopic procedure.  Patient ID and intended procedure confirmed with present staff. Received instructions for my participation in the procedure from the performing physician.  

## 2021-06-01 NOTE — Progress Notes (Signed)
Report to PACU, RN, vss, BBS= Clear.  

## 2021-06-01 NOTE — Op Note (Addendum)
[facility] ?Patient Name: Vanessa Bruce ?Procedure Date: 06/01/2021 1:09 PM ?MRN: [id number] ?Endoscopist: Thornton Park MD, MD ?Age: 61 ?Referring MD:  ?Date of Birth: 1960/06/19 ?Gender: Female ?Account #: [id number] ?Procedure:                Colonoscopy ?Indications:              Screening for colorectal malignant neoplasm ?                          Patient reported normal colonoscopy in 2012 ?                          Family history largely unknown ?Medicines:                Monitored Anesthesia Care ?Procedure:                Pre-Anesthesia Assessment: ?                          - Prior to the procedure, a History and Physical  ?                          was performed, and patient medications and  ?                          allergies were reviewed. The patient's tolerance of  ?                          previous anesthesia was also reviewed. The risks  ?                          and benefits of the procedure and the sedation  ?                          options and risks were discussed with the patient.  ?                          All questions were answered, and informed consent  ?                          was obtained. Prior Anticoagulants: The patient has  ?                          taken no previous anticoagulant or antiplatelet  ?                          agents. ASA Grade Assessment: II - A patient with  ?                          mild systemic disease. After reviewing the risks  ?                          and benefits, the patient was deemed in  ?  satisfactory condition to undergo the procedure. ?                          After obtaining informed consent, the colonoscope  ?                          was passed under direct vision. Throughout the  ?                          procedure, the patient's blood pressure, pulse, and  ?                          oxygen saturations were monitored continuously. The  ?                          CF [id number] #[id number] was introduced through  the anus  ?                          and advanced to the 3 cm into the ileum. A second  ?                          forward view of the right colon was performed. The  ?                          colonoscopy was performed without difficulty. The  ?                          patient tolerated the procedure well. The quality  ?                          of the bowel preparation was excellent. The  ?                          terminal ileum, ileocecal valve, appendiceal  ?                          orifice, and rectum were photographed. ?Scope In: 1:24:02 PM ?Scope Out: 1:37:50 PM ?Scope Withdrawal Time: 0 hours 9 minutes 40 seconds  ?Total Procedure Duration: 0 hours 13 minutes 48 seconds  ?Findings:                 Non-bleeding external and internal hemorrhoids were  ?                          found. ?                          A 2 mm polyp was found in the descending colon. The  ?                          polyp was sessile. The polyp was removed with a  ?                          cold snare. Resection and retrieval were complete.  ?  Estimated blood loss was minimal. ?                          A 6-8 mm polyp was found in the transverse colon.  ?                          The polyp was semi-pedunculated. The polyp was  ?                          removed with a cold snare. Resection and retrieval  ?                          were complete. Estimated blood loss was minimal. ?                          The exam was otherwise without abnormality on  ?                          direct and retroflexion views. ?Complications:            No immediate complications. ?Estimated Blood Loss:     Estimated blood loss was minimal. ?Impression:               - Non-bleeding external and internal hemorrhoids. ?                          - One 2 mm polyp in the descending colon, removed  ?                          with a cold snare. Resected and retrieved. ?                          - One 6-8 mm polyp in the transverse  colon, removed  ?                          with a cold snare. Resected and retrieved. ?                          - The examination was otherwise normal on direct  ?                          and retroflexion views. ?Recommendation:           - Patient has a contact number available for  ?                          emergencies. The signs and symptoms of potential  ?                          delayed complications were discussed with the  ?                          patient. Return to normal activities tomorrow.  ?  Written discharge instructions were provided to the  ?                          patient. ?                          - Resume previous diet. ?                          - Continue present medications. ?                          - Await pathology results. ?                          - Repeat colonoscopy date to be determined after  ?                          pending pathology results are reviewed for  ?                          surveillance. ?                          - Emerging evidence supports eating a diet of  ?                          fruits, vegetables, grains, calcium, and yogurt  ?                          while reducing red meat and alcohol may reduce the  ?                          risk of colon cancer. ?                          - Thank you for allowing me to be involved in your  ?                          colon cancer prevention. ?Thornton Park MD, MD ?06/01/2021 1:41:49 PM ?This report has been signed electronically. ?

## 2021-06-03 ENCOUNTER — Telehealth: Payer: Self-pay

## 2021-06-03 NOTE — Telephone Encounter (Signed)
?  Follow up Call- ? ? ?  06/01/2021  ? 12:48 PM  ?Call back number  ?Post procedure Call Back phone  # [phone]  ?Permission to leave phone message Yes  ?  ? ?Patient questions: ? ?Do you have a fever, pain , or abdominal swelling? No. ?Pain Score  0 * ? ?Have you tolerated food without any problems? Yes.   ? ?Have you been able to return to your normal activities? Yes.   ? ?Do you have any questions about your discharge instructions: ?Diet   No. ?Medications  No. ?Follow up visit  No. ? ?Do you have questions or concerns about your Care? No. ? ?Actions: ?* If pain score is 4 or above: ?No action needed, pain <4. ? ? ?

## 2021-06-03 NOTE — Telephone Encounter (Signed)
?  Follow up Call- ? ? ?  06/01/2021  ? 12:48 PM  ?Call back number  ?Post procedure Call Back phone  # [phone]  ?Permission to leave phone message Yes  ?  ? ?Patient questions: ? ?Do you have a fever, pain , or abdominal swelling? No. ?Pain Score  0 * ? ?Have you tolerated food without any problems? Yes.   ? ?Have you been able to return to your normal activities? Yes.   ? ?Do you have any questions about your discharge instructions: ?Diet   No. ?Medications  No. ?Follow up visit  No. ? ?Do you have questions or concerns about your Care? No. ? ?Actions: ?* If pain score is 4 or above: ?No action needed, pain <4. ? ? ?

## 2021-06-06 ENCOUNTER — Encounter: Payer: Self-pay | Admitting: Gastroenterology

## 2021-07-06 ENCOUNTER — Other Ambulatory Visit (HOSPITAL_COMMUNITY): Payer: Self-pay | Admitting: Internal Medicine

## 2021-07-06 ENCOUNTER — Ambulatory Visit (HOSPITAL_COMMUNITY)
Admission: RE | Admit: 2021-07-06 | Discharge: 2021-07-06 | Disposition: A | Discharge status: Home / Self Care | Payer: BC Managed Care – PPO | Source: Ambulatory Visit | Attending: Internal Medicine | Admitting: Internal Medicine

## 2021-07-06 DIAGNOSIS — M79661 Pain in right lower leg: Secondary | ICD-10-CM | POA: Insufficient documentation

## 2021-07-06 DIAGNOSIS — R030 Elevated blood-pressure reading, without diagnosis of hypertension: Secondary | ICD-10-CM | POA: Diagnosis not present

## 2021-07-21 DIAGNOSIS — I82441 Acute embolism and thrombosis of right tibial vein: Secondary | ICD-10-CM | POA: Diagnosis not present

## 2021-07-21 DIAGNOSIS — M79661 Pain in right lower leg: Secondary | ICD-10-CM | POA: Diagnosis not present

## 2021-07-31 DIAGNOSIS — M79661 Pain in right lower leg: Secondary | ICD-10-CM | POA: Diagnosis not present

## 2021-07-31 DIAGNOSIS — M7989 Other specified soft tissue disorders: Secondary | ICD-10-CM | POA: Diagnosis not present

## 2021-07-31 DIAGNOSIS — I82401 Acute embolism and thrombosis of unspecified deep veins of right lower extremity: Secondary | ICD-10-CM | POA: Diagnosis not present

## 2021-07-31 DIAGNOSIS — Z86718 Personal history of other venous thrombosis and embolism: Secondary | ICD-10-CM | POA: Diagnosis not present

## 2021-09-12 ENCOUNTER — Ambulatory Visit
Admission: EM | Admit: 2021-09-12 | Discharge: 2021-09-12 | Disposition: A | Discharge status: Home / Self Care | Payer: BC Managed Care – PPO

## 2021-09-12 DIAGNOSIS — R21 Rash and other nonspecific skin eruption: Secondary | ICD-10-CM

## 2021-09-12 MED ORDER — TRIAMCINOLONE ACETONIDE 0.1 % EX CREA: 1.0000 | TOPICAL_CREAM | Freq: Two times a day (BID) | CUTANEOUS | 0 refills | Status: DC

## 2021-09-12 MED ORDER — DOXYCYCLINE HYCLATE 100 MG PO CAPS: 100.0000 mg | ORAL_CAPSULE | Freq: Two times a day (BID) | ORAL | 0 refills | Status: DC

## 2021-09-12 NOTE — ED Provider Notes (Signed)
[facility]    CSN: [id number] Arrival date & time: 09/12/21  1351      History   Chief Complaint Chief Complaint  Patient presents with   Insect Bite    HPI Vanessa Bruce is a 61 y.o. female.   Patient presents today with a 10-day history of changing pruritic rash on her right forearm.  Reports that symptoms began after she was outside working in the garden and she reached under a bush to pull a weed.  She denies any additional exposures or changes to personal hygiene products including soaps or detergents.  She has been applying antibiotic ointment and topical steroids available over-the-counter without improvement of symptoms.  She has also tried her over-the-counter antihistamine which has provided some relief but not resolution of symptoms.  She is concerned because the rash has developed a bull's-eye appearance.  She did not see intake in this area or witness the bite but is concerned given how the rash looks.  She denies additional symptoms including fever, headache, dizziness, arthralgias, facial asymmetry, dysarthria, nausea, vomiting.  Denies any recent antibiotic use.    Past Medical History:  Diagnosis Date   DVT (deep venous thrombosis) ([place]) 2009   GERD (gastroesophageal reflux disease)    Hypothyroidism     Patient Active Problem List   Diagnosis Date Noted   DVT (deep venous thrombosis) ([facility])    Hypothyroid    GERD (gastroesophageal reflux disease)     Past Surgical History:  Procedure Laterality Date   WISDOM TOOTH EXTRACTION      OB History     Gravida  0   Para  0   Term  0   Preterm  0   AB  0   Living  0      SAB  0   IAB  0   Ectopic  0   Multiple  0   Live Births  0            Home Medications    Prior to Admission medications   Medication Sig Start Date End Date Taking? Authorizing Provider  Casanthranol-Docusate Sodium (STOOL SOFTENER PLUS PO) Take by mouth.   Yes [provider]   doxycycline (VIBRAMYCIN) 100 MG capsule Take 1 capsule (100 mg total) by mouth 2 (two) times daily. 09/12/21  Yes Tani Virgo K, PA-C  triamcinolone cream (KENALOG) 0.1 % Apply 1 Application topically 2 (two) times daily. 09/12/21  Yes Carrine Kroboth, Derry Skill, PA-C  Ascorbic Acid (VITAMIN C) 500 MG/5ML LIQD Take 1 capsule by mouth daily. 01/30/19   [provider]  Cholecalciferol (VITAMIN D3) 100000 UNIT/GM POWD Take 1 capsule by mouth daily. 01/30/19   [provider]  ELIQUIS 5 MG TABS tablet Take 5 mg by mouth 2 (two) times daily. 08/13/21   [provider]  esomeprazole (NEXIUM) 40 MG capsule Take 40 mg by mouth daily at 12 noon.    [provider]  Flaxseed, Linseed, (FLAX SEED OIL) 1000 MG CAPS Take 1 capsule by mouth daily. 01/26/13   [provider]  levothyroxine (SYNTHROID, LEVOTHROID) 100 MCG tablet Take 100 mcg by mouth daily before breakfast.    [provider]  Omega-3 Fatty Acids (FISH OIL) 1200 MG CAPS Take 1 capsule by mouth daily. 01/26/13   [provider]    Family History Family History  Adopted: Yes  Problem Relation Age of Onset   Colon cancer Neg Hx    Colon polyps Neg Hx  Esophageal cancer Neg Hx    Rectal cancer Neg Hx    Prostate cancer Neg Hx     Social History Social History   Tobacco Use   Smoking status: Former    Types: Cigarettes    Quit date: 10/14/1979    Years since quitting: 41.9   Smokeless tobacco: Never  Vaping Use   Vaping Use: Never used  Substance Use Topics   Alcohol use: No   Drug use: No     Allergies   Polyethylene glycol 3350   Review of Systems Review of Systems  Constitutional:  Positive for activity change. Negative for appetite change, fatigue and fever.  Respiratory:  Negative for cough and shortness of breath.   Cardiovascular:  Negative for chest pain.  Gastrointestinal:  Negative for abdominal pain, diarrhea, nausea and vomiting.  Musculoskeletal:  Negative  for arthralgias and myalgias.  Skin:  Positive for rash.  Neurological:  Negative for dizziness, facial asymmetry, light-headedness and headaches.     Physical Exam Triage Vital Signs ED Triage Vitals [09/12/21 1359]  Enc Vitals Group     BP (!) 164/98     Pulse Rate (!) 58     Resp 16     Temp 97.9 F (36.6 C)     Temp Source Oral     SpO2 97 %     Weight      Height      Head Circumference      Peak Flow      Pain Score 0     Pain Loc      Pain Edu?      Excl. in [place]?    No data found.  Updated Vital Signs BP (!) 164/98 (BP Location: Right Arm)   Pulse (!) 58   Temp 97.9 F (36.6 C) (Oral)   Resp 16   SpO2 97%   Visual Acuity Right Eye Distance:   Left Eye Distance:   Bilateral Distance:    Right Eye Near:   Left Eye Near:    Bilateral Near:     Physical Exam Vitals reviewed.  Constitutional:      General: She is awake. She is not in acute distress.    Appearance: Normal appearance. She is well-developed. She is not ill-appearing.     Comments: Very pleasant female appears stated age in no acute distress sitting comfortably in exam room  HENT:     Head: Normocephalic and atraumatic.  Cardiovascular:     Rate and Rhythm: Normal rate and regular rhythm.     Heart sounds: Normal heart sounds, S1 normal and S2 normal. No murmur heard. Pulmonary:     Effort: Pulmonary effort is normal.     Breath sounds: Normal breath sounds. No wheezing, rhonchi or rales.  Skin:    Comments: Erythematous expanding rash with target-like appearance noted right forearm.  Maculopapular rash noted right upper arm over bicep.  Psychiatric:        Behavior: Behavior is cooperative.      UC Treatments / Results  Labs (all labs ordered are listed, but only abnormal results are displayed) Labs Reviewed - No data to display  EKG   Radiology No results found.  Procedures Procedures (including critical care time)  Medications Ordered in UC Medications - No data to  display  Initial Impression / Assessment and Plan / UC Course  I have reviewed the triage vital signs and the nursing notes.  Pertinent labs & imaging results that were  available during my care of the patient were reviewed by me and considered in my medical decision making (see chart for details).     Rash on right forearm similar to erythema migrans in appearance though history not consistent with tick bite.  We will cover for tickborne illness given presentation in clinic with doxycycline.  Patient was instructed to avoid prolonged sun exposure with this medication due to positivity associated with this medicine.  Recommended hypoallergenic soaps and detergents.  She is to alternate H1 and H2 blocker for additional symptom relief.  She is to keep area clean and apply triamcinolone to twice a day as needed.  Offered injection of steroids in clinic but patient declined this as she did not believe symptoms were severe enough to warrant this.  Recommended follow-up with PCP next week.  Discussed that if she has any worsening symptoms including high fever, body aches, nausea/vomiting she needs to be seen immediately.  Strict return precautions given.  Final Clinical Impressions(s) / UC Diagnoses   Final diagnoses:  Rash and nonspecific skin eruption     Discharge Instructions      I think it is unlikely that you have a tick bite but your rash has a concerning appearance.  We will start doxycycline to cover for tickborne illness.  Stay out of the sun while on this medication as we discussed.  Keep area clean with soap and water and apply triamcinolone twice daily.  You can use over-the-counter medications including cetirizine in the morning and famotidine at night to help with itching.  Use hypoallergenic soaps and detergents.  If your rash is spreading or you have additional symptoms including fever, body aches, nausea, vomiting you need to be seen immediately.  Follow-up with your PCP next  week.     ED Prescriptions     Medication Sig Dispense Auth. Provider   doxycycline (VIBRAMYCIN) 100 MG capsule Take 1 capsule (100 mg total) by mouth 2 (two) times daily. 20 capsule Taivon Haroon K, PA-C   triamcinolone cream (KENALOG) 0.1 % Apply 1 Application topically 2 (two) times daily. 30 g Dennie Moltz, Derry Skill, PA-C      PDMP not reviewed this encounter.   Terrilee Croak, PA-C 09/12/21 1444

## 2021-09-12 NOTE — ED Triage Notes (Signed)
Pt reports 2 tick bite in the right arm x 10 days. States is itching.  Pt did not removed the tick.

## 2021-09-12 NOTE — Discharge Instructions (Signed)
I think it is unlikely that you have a tick bite but your rash has a concerning appearance.  We will start doxycycline to cover for tickborne illness.  Stay out of the sun while on this medication as we discussed.  Keep area clean with soap and water and apply triamcinolone twice daily.  You can use over-the-counter medications including cetirizine in the morning and famotidine at night to help with itching.  Use hypoallergenic soaps and detergents.  If your rash is spreading or you have additional symptoms including fever, body aches, nausea, vomiting you need to be seen immediately.  Follow-up with your PCP next week.

## 2021-09-23 ENCOUNTER — Other Ambulatory Visit: Payer: Self-pay | Admitting: *Deleted

## 2021-09-23 DIAGNOSIS — I872 Venous insufficiency (chronic) (peripheral): Secondary | ICD-10-CM

## 2021-10-26 NOTE — Progress Notes (Unsigned)
[facility] [place]  ASSESSMENT / PLAN: Vanessa Bruce is a 61 y.o. female with chronic venous insufficiency of *** lower extremity causing *** (C*** disease).  Venous duplex is significant for *** Recommend compression and elevation for symptomatic relief. ***Follow up with me in three months to discuss saphenous vein ablation. ***Follow up with me as needed.   CHIEF COMPLAINT: ***  HISTORY OF PRESENT ILLNESS: Vanessa Bruce is a 61 y.o. female ***  VASCULAR SURGICAL HISTORY: ***  VASCULAR RISK FACTORS: {FINDINGS; POSITIVE NEGATIVE:[id number]} history of stroke / transient ischemic attack. {FINDINGS; POSITIVE NEGATIVE:[id number]} history of coronary artery disease. *** history of PCI. *** history of CABG.  {FINDINGS; POSITIVE NEGATIVE:[id number]} history of diabetes mellitus. Last A1c ***. {FINDINGS; POSITIVE NEGATIVE:[id number]} history of smoking. *** actively smoking. {FINDINGS; POSITIVE NEGATIVE:[id number]} history of hypertension. *** drug regimen with *** control. {FINDINGS; POSITIVE NEGATIVE:[id number]} history of chronic kidney disease.  Last GFR ***. CKD {stage:30421363}. {FINDINGS; POSITIVE NEGATIVE:[id number]} history of chronic obstructive pulmonary disease, treated with ***.  FUNCTIONAL STATUS: ECOG performance status: {findings; ecog performance status:31780} Ambulatory status: {TNHAmbulation:25868}  Past Medical History:  Diagnosis Date   DVT (deep venous thrombosis) ([facility]) 2009   GERD (gastroesophageal reflux disease)    Hypothyroidism     Past Surgical History:  Procedure Laterality Date   WISDOM TOOTH EXTRACTION      Family History  Adopted: Yes  Problem Relation Age of Onset   Colon cancer Neg Hx    Colon polyps Neg Hx    Esophageal cancer Neg Hx    Rectal cancer Neg Hx    Prostate cancer Neg Hx     Social History   Socioeconomic History   Marital status: Married    Spouse name: Not on file   Number of children: 0    Years of education: Not on file   Highest education level: Not on file  Occupational History   Occupation: self employed  Tobacco Use   Smoking status: Former    Types: Cigarettes    Quit date: 10/14/1979    Years since quitting: 42.0   Smokeless tobacco: Never  Vaping Use   Vaping Use: Never used  Substance and Sexual Activity   Alcohol use: No   Drug use: No   Sexual activity: Yes    Partners: Male    Birth control/protection: Post-menopausal    Comment: 1st intercourse- 9, partners- 36, married- 16 yrs   Other Topics Concern   Not on file  Social History Narrative   Not on file   Social Determinants of Health   Financial Resource Strain: Not on file  Food Insecurity: Not on file  Transportation Needs: Not on file  Physical Activity: Not on file  Stress: Not on file  Social Connections: Not on file  Intimate Partner Violence: Not on file    Allergies  Allergen Reactions   Polyethylene Glycol 3350 Hives    Current Outpatient Medications  Medication Sig Dispense Refill   Ascorbic Acid (VITAMIN C) 500 MG/5ML LIQD Take 1 capsule by mouth daily.     Casanthranol-Docusate Sodium (STOOL SOFTENER PLUS PO) Take by mouth.     Cholecalciferol (VITAMIN D3) 100000 UNIT/GM POWD Take 1 capsule by mouth daily.     doxycycline (VIBRAMYCIN) 100 MG capsule Take 1 capsule (100 mg total) by mouth 2 (two) times daily. 20 capsule 0   ELIQUIS 5 MG TABS tablet Take 5 mg by mouth 2 (two) times daily.     esomeprazole (NEXIUM) 40 MG  capsule Take 40 mg by mouth daily at 12 noon.     Flaxseed, Linseed, (FLAX SEED OIL) 1000 MG CAPS Take 1 capsule by mouth daily.     levothyroxine (SYNTHROID, LEVOTHROID) 100 MCG tablet Take 100 mcg by mouth daily before breakfast.     Omega-3 Fatty Acids (FISH OIL) 1200 MG CAPS Take 1 capsule by mouth daily.     triamcinolone cream (KENALOG) 0.1 % Apply 1 Application topically 2 (two) times daily. 30 g 0   No current facility-administered medications for  this visit.    PHYSICAL EXAM There were no vitals filed for this visit.  Constitutional: *** appearing. *** distress. Appears *** nourished.  Neurologic: CN ***. *** focal findings. *** sensory loss. Psychiatric: *** Mood and affect symmetric and appropriate. Eyes: *** No icterus. No conjunctival pallor. Ears, nose, throat: *** mucous membranes moist. Midline trachea.  Cardiac: *** rate and rhythm.  Respiratory: *** unlabored. Abdominal: *** soft, non-tender, non-distended.  Peripheral vascular: *** Extremity: *** edema. *** cyanosis. *** pallor.  Skin: *** gangrene. *** ulceration.  Lymphatic: *** Stemmer's sign. *** palpable lymphadenopathy.    PERTINENT LABORATORY AND RADIOLOGIC DATA  Most recent CBC    Latest Ref Rng & Units 07/09/2017   10:57 PM 08/02/2011    8:20 PM  CBC  WBC 4.0 - 10.5 K/uL 5.7  6.7   Hemoglobin 12.0 - 15.0 g/dL 12.9  13.3   Hematocrit 36.0 - 46.0 % 39.7  39.1   Platelets 150 - 400 K/uL 324  295      Most recent CMP    Latest Ref Rng & Units 07/09/2017   10:57 PM 08/02/2011    8:20 PM  CMP  Glucose 65 - 99 mg/dL 93  95   BUN 6 - 20 mg/dL 16  21   Creatinine 0.44 - 1.00 mg/dL 0.91  1.00   Sodium 135 - 145 mmol/L 140  140   Potassium 3.5 - 5.1 mmol/L 3.6  3.7   Chloride 101 - 111 mmol/L 105  101   CO2 22 - 32 mmol/L 22  29   Calcium 8.9 - 10.3 mg/dL 9.2  9.7   Total Protein 6.5 - 8.1 g/dL 7.7  7.3   Total Bilirubin 0.3 - 1.2 mg/dL 0.1  0.2   Alkaline Phos 38 - 126 U/L 78  68   AST 15 - 41 U/L 20  18   ALT 14 - 54 U/L 19  16     Renal function CrCl cannot be calculated (Patient's most recent lab result is older than the maximum 21 days allowed.).  No results found for: "HGBA1C"  No results found for: "Bells", "LDLC", "HIRISKLDL", "POCLDL", "LDLDIRECT", "REALLDLC", "TOTLDLC"   Vascular Imaging: ***  Silus Lanzo N. Stanford Breed, MD [facility] Phone Number: [phone] 10/26/2021 3:17 PM  Total time  spent on preparing this encounter including chart review, data review, collecting history, examining the patient, coordinating care for this {tnhtimebilling:26202}  Portions of this report may have been transcribed using voice recognition software.  Every effort has been made to ensure accuracy; however, inadvertent computerized transcription errors may still be present.

## 2021-10-27 ENCOUNTER — Ambulatory Visit (INDEPENDENT_AMBULATORY_CARE_PROVIDER_SITE_OTHER): Payer: BC Managed Care – PPO | Admitting: Vascular Surgery

## 2021-10-27 ENCOUNTER — Ambulatory Visit (HOSPITAL_COMMUNITY)
Admission: RE | Admit: 2021-10-27 | Discharge: 2021-10-27 | Disposition: A | Discharge status: Home / Self Care | Payer: BC Managed Care – PPO | Source: Ambulatory Visit | Attending: Vascular Surgery | Admitting: Vascular Surgery

## 2021-10-27 ENCOUNTER — Encounter: Payer: Self-pay | Admitting: Vascular Surgery

## 2021-10-27 VITALS — BP 159/108 | HR 72 | Temp 97.7°F | Resp 20 | Ht 66.0 in | Wt 172.0 lb

## 2021-10-27 DIAGNOSIS — Z86718 Personal history of other venous thrombosis and embolism: Secondary | ICD-10-CM

## 2021-10-27 DIAGNOSIS — I872 Venous insufficiency (chronic) (peripheral): Secondary | ICD-10-CM | POA: Insufficient documentation

## 2021-11-03 ENCOUNTER — Other Ambulatory Visit: Payer: Self-pay | Admitting: Internal Medicine

## 2021-11-03 DIAGNOSIS — Z1231 Encounter for screening mammogram for malignant neoplasm of breast: Secondary | ICD-10-CM

## 2021-12-09 ENCOUNTER — Encounter: Payer: Self-pay | Admitting: Obstetrics & Gynecology

## 2021-12-09 ENCOUNTER — Ambulatory Visit (INDEPENDENT_AMBULATORY_CARE_PROVIDER_SITE_OTHER): Payer: BC Managed Care – PPO | Admitting: Obstetrics & Gynecology

## 2021-12-09 VITALS — BP 118/80 | HR 71 | Ht 66.25 in | Wt 172.0 lb

## 2021-12-09 DIAGNOSIS — Z01419 Encounter for gynecological examination (general) (routine) without abnormal findings: Secondary | ICD-10-CM

## 2021-12-09 DIAGNOSIS — M8589 Other specified disorders of bone density and structure, multiple sites: Secondary | ICD-10-CM | POA: Diagnosis not present

## 2021-12-09 DIAGNOSIS — Z780 Asymptomatic menopausal state: Secondary | ICD-10-CM

## 2021-12-09 NOTE — Progress Notes (Signed)
Vanessa Bruce 1960-10-11 [id number]   History:    61 y.o. G0 Married (English man).  Started a long camper trip with her [place]   RP:  Established patient presenting for annual gyn exam    HPI: Postmenopause, well on no HRT.  No PMB.  No pelvic pain.  Improved libido, no pain with intercourse using coconut oil and collagen.  Pap Neg 10/2020.  No h/o abnormal Pap.  No indication to repeat a Pap at this time.  Urine/BMs wnl.  Breasts wnl.  Mammo Neg 11/2020.  BMI 27.55. Physically active, hiking and camper camping.  Had a DVT while hiking earlier this year.  Health labs with Fam MD. Harriet Masson 05/2021.  Bone density Osteopenia stable in 2022.      Past medical history,surgical history, family history and social history were all reviewed and documented in the EPIC chart.  Gynecologic History No LMP recorded. Patient is postmenopausal.  Obstetric History OB History  Gravida Para Term Preterm AB Living  0 0 0 0 0 0  SAB IAB Ectopic Multiple Live Births  0 0 0 0 0     ROS: A ROS was performed and pertinent positives and negatives are included in the history. GENERAL: No fevers or chills. HEENT: No change in vision, no earache, sore throat or sinus congestion. NECK: No pain or stiffness. CARDIOVASCULAR: No chest pain or pressure. No palpitations. PULMONARY: No shortness of breath, cough or wheeze. GASTROINTESTINAL: No abdominal pain, nausea, vomiting or diarrhea, melena or bright red blood per rectum. GENITOURINARY: No urinary frequency, urgency, hesitancy or dysuria. MUSCULOSKELETAL: No joint or muscle pain, no back pain, no recent trauma. DERMATOLOGIC: No rash, no itching, no lesions. ENDOCRINE: No polyuria, polydipsia, no heat or cold intolerance. No recent change in weight. HEMATOLOGICAL: No anemia or easy bruising or bleeding. NEUROLOGIC: No headache, seizures, numbness, tingling or weakness. PSYCHIATRIC: No depression, no loss of interest in normal activity or  change in sleep pattern.     Exam:   BP 118/80   Pulse 71   Ht 5' 6.25" (1.683 m)   Wt 172 lb (78 kg)   SpO2 98%   BMI 27.55 kg/m   Body mass index is 27.55 kg/m.  General appearance : Well developed well nourished female. No acute distress HEENT: Eyes: no retinal hemorrhage or exudates,  Neck supple, trachea midline, no carotid bruits, no thyroidmegaly Lungs: Clear to auscultation, no rhonchi or wheezes, or rib retractions  Heart: Regular rate and rhythm, no murmurs or gallops Breast:Examined in sitting and supine position were symmetrical in appearance, no palpable masses or tenderness,  no skin retraction, no nipple inversion, no nipple discharge, no skin discoloration, no axillary or supraclavicular lymphadenopathy Abdomen: no palpable masses or tenderness, no rebound or guarding Extremities: no edema or skin discoloration or tenderness  Pelvic: Vulva: Normal             Vagina: No gross lesions or discharge  Cervix: No gross lesions or discharge  Uterus  AV, normal size, shape and consistency, non-tender and mobile  Adnexa  Without masses or tenderness  Anus: Normal   Assessment/Plan:  61 y.o. female for annual exam   1. Well female exam with routine gynecological exam Postmenopause, well on no HRT.  No PMB.  No pelvic pain.  Improved libido, no pain with intercourse using coconut oil and collagen.  Pap Neg 10/2020.  No h/o abnormal Pap.  No indication to repeat a Pap at  this time.  Urine/BMs wnl.  Breasts wnl.  Mammo Neg 11/2020.  BMI 27.55. Physically active, hiking and camper camping.  Had a DVT while hiking earlier this year.  Health labs with Fam MD. Harriet Masson 05/2021. Bone density Osteopenia stable in 2022.     2. Postmenopause Postmenopause, well on no HRT.  No PMB.  No pelvic pain.  Improved libido, no pain with intercourse using coconut oil and collagen.   3. Osteopenia of multiple sites Bone density Osteopenia stable in 2022.  Repeat BD in 2024.  Vit D, Vit K2,  Ca++ 1.5 g/d total.  Wt bearing activities.    Other orders - Ascorbic Acid (VITAMIN C PO); Take by mouth. - Cholecalciferol (VITAMIN D3 PO); Take 25 mcg by mouth.   Princess Bruins MD, 2:15 PM 12/09/2021

## 2021-12-13 ENCOUNTER — Encounter: Payer: Self-pay | Admitting: Obstetrics & Gynecology

## 2021-12-16 ENCOUNTER — Ambulatory Visit: Payer: BC Managed Care – PPO

## 2022-01-13 ENCOUNTER — Ambulatory Visit
Admission: RE | Admit: 2022-01-13 | Discharge: 2022-01-13 | Disposition: A | Discharge status: Home / Self Care | Payer: BC Managed Care – PPO | Source: Ambulatory Visit | Attending: Internal Medicine | Admitting: Internal Medicine

## 2022-01-13 DIAGNOSIS — Z1231 Encounter for screening mammogram for malignant neoplasm of breast: Secondary | ICD-10-CM | POA: Diagnosis not present

## 2022-04-05 DIAGNOSIS — D649 Anemia, unspecified: Secondary | ICD-10-CM | POA: Diagnosis not present

## 2022-04-05 DIAGNOSIS — M8589 Other specified disorders of bone density and structure, multiple sites: Secondary | ICD-10-CM | POA: Diagnosis not present

## 2022-04-05 DIAGNOSIS — E559 Vitamin D deficiency, unspecified: Secondary | ICD-10-CM | POA: Diagnosis not present

## 2022-04-05 DIAGNOSIS — E039 Hypothyroidism, unspecified: Secondary | ICD-10-CM | POA: Diagnosis not present

## 2022-04-05 DIAGNOSIS — E785 Hyperlipidemia, unspecified: Secondary | ICD-10-CM | POA: Diagnosis not present

## 2022-04-13 DIAGNOSIS — Z0000 Encounter for general adult medical examination without abnormal findings: Secondary | ICD-10-CM | POA: Diagnosis not present

## 2022-04-13 DIAGNOSIS — Z1339 Encounter for screening examination for other mental health and behavioral disorders: Secondary | ICD-10-CM | POA: Diagnosis not present

## 2022-04-13 DIAGNOSIS — I83893 Varicose veins of bilateral lower extremities with other complications: Secondary | ICD-10-CM | POA: Diagnosis not present

## 2022-04-13 DIAGNOSIS — Z1331 Encounter for screening for depression: Secondary | ICD-10-CM | POA: Diagnosis not present

## 2022-11-03 ENCOUNTER — Other Ambulatory Visit: Payer: Self-pay | Admitting: Internal Medicine

## 2022-11-03 DIAGNOSIS — Z1231 Encounter for screening mammogram for malignant neoplasm of breast: Secondary | ICD-10-CM

## 2022-12-14 ENCOUNTER — Ambulatory Visit: Payer: BC Managed Care – PPO | Admitting: Obstetrics & Gynecology

## 2022-12-14 ENCOUNTER — Ambulatory Visit: Payer: BC Managed Care – PPO | Admitting: Obstetrics and Gynecology

## 2022-12-16 ENCOUNTER — Encounter: Payer: Self-pay | Admitting: Obstetrics and Gynecology

## 2022-12-16 ENCOUNTER — Ambulatory Visit: Payer: BC Managed Care – PPO | Admitting: Obstetrics and Gynecology

## 2022-12-16 VITALS — BP 110/70 | HR 68 | Ht 66.5 in | Wt 166.0 lb

## 2022-12-16 DIAGNOSIS — M8589 Other specified disorders of bone density and structure, multiple sites: Secondary | ICD-10-CM | POA: Diagnosis not present

## 2022-12-16 DIAGNOSIS — N958 Other specified menopausal and perimenopausal disorders: Secondary | ICD-10-CM

## 2022-12-16 DIAGNOSIS — Z01419 Encounter for gynecological examination (general) (routine) without abnormal findings: Secondary | ICD-10-CM

## 2022-12-16 NOTE — Assessment & Plan Note (Addendum)
Cervical cancer screening performed according to ASCCP guidelines. Encouraged annual mammogram screening Colonoscopy UTD DXA UTD Labs and immunizations with her primary Encouraged safe sexual practices as indicated Encouraged healthy lifestyle practices with diet and exercise For patients under 50-64yo, I recommend 1200mg  calcium daily and 600IU of vitamin D daily.

## 2022-12-16 NOTE — Patient Instructions (Signed)
Considering applying coconut oil to the vulva after showers. You could also using daily vaginal moisturizers by brands like Good Clean Love and Ah! Yes.  For lubricants, you can try Rubin Payor, Ah! Yes, or Good Clean love.  For patients under 50-64yo, I recommend 1200mg  calcium daily and 600IU of vitamin D daily.  Health Maintenance, Female Adopting a healthy lifestyle and getting preventive care are important in promoting health and wellness. Ask your health care provider about: The right schedule for you to have regular tests and exams. Things you can do on your own to prevent diseases and keep yourself healthy. What should I know about diet, weight, and exercise? Eat a healthy diet  Eat a diet that includes plenty of vegetables, fruits, low-fat dairy products, and lean protein. Do not eat a lot of foods that are high in solid fats, added sugars, or sodium. Maintain a healthy weight Body mass index (BMI) is used to identify weight problems. It estimates body fat based on height and weight. Your health care provider can help determine your BMI and help you achieve or maintain a healthy weight. Get regular exercise Get regular exercise. This is one of the most important things you can do for your health. Most adults should: Exercise for at least 150 minutes each week. The exercise should increase your heart rate and make you sweat (moderate-intensity exercise). Do strengthening exercises at least twice a week. This is in addition to the moderate-intensity exercise. Spend less time sitting. Even light physical activity can be beneficial. Watch cholesterol and blood lipids Have your blood tested for lipids and cholesterol at 64 years of age, then have this test every 5 years. Have your cholesterol levels checked more often if: Your lipid or cholesterol levels are high. You are older than 64 years of age. You are at high risk for heart disease. What should I know about cancer  screening? Depending on your health history and family history, you may need to have cancer screening at various ages. This may include screening for: Breast cancer. Cervical cancer. Colorectal cancer. Skin cancer. Lung cancer. What should I know about heart disease, diabetes, and high blood pressure? Blood pressure and heart disease High blood pressure causes heart disease and increases the risk of stroke. This is more likely to develop in people who have high blood pressure readings or are overweight. Have your blood pressure checked: Every 3-5 years if you are 27-72 years of age. Every year if you are 31 years old or older. Diabetes Have regular diabetes screenings. This checks your fasting blood sugar level. Have the screening done: Once every three years after age 2 if you are at a normal weight and have a low risk for diabetes. More often and at a younger age if you are overweight or have a high risk for diabetes. What should I know about preventing infection? Hepatitis B If you have a higher risk for hepatitis B, you should be screened for this virus. Talk with your health care provider to find out if you are at risk for hepatitis B infection. Hepatitis C Testing is recommended for: Everyone born from [birth year] through 1967. Anyone with known risk factors for hepatitis C. Sexually transmitted infections (STIs) Get screened for STIs, including gonorrhea and chlamydia, if: You are sexually active and are younger than 64 years of age. You are older than 64 years of age and your health care provider tells you that you are at risk for this type of infection. Your sexual  activity has changed since you were last screened, and you are at increased risk for chlamydia or gonorrhea. Ask your health care provider if you are at risk. Ask your health care provider about whether you are at high risk for HIV. Your health care provider may recommend a prescription medicine to help prevent HIV  infection. If you choose to take medicine to prevent HIV, you should first get tested for HIV. You should then be tested every 3 months for as long as you are taking the medicine. Osteoporosis and menopause Osteoporosis is a disease in which the bones lose minerals and strength with aging. This can result in bone fractures. If you are 82 years old or older, or if you are at risk for osteoporosis and fractures, ask your health care provider if you should: Be screened for bone loss. Take a calcium or vitamin D supplement to lower your risk of fractures. Be given hormone replacement therapy (HRT) to treat symptoms of menopause. Follow these instructions at home: Alcohol use Do not drink alcohol if: Your health care provider tells you not to drink. You are pregnant, may be pregnant, or are planning to become pregnant. If you drink alcohol: Limit how much you have to: 0-1 drink a day. Know how much alcohol is in your drink. In the [place] one drink equals one 12 oz bottle of beer (355 mL), one 5 oz glass of wine (148 mL), or one 1 oz glass of hard liquor (44 mL). Lifestyle Do not use any products that contain nicotine or tobacco. These products include cigarettes, chewing tobacco, and vaping devices, such as e-cigarettes. If you need help quitting, ask your health care provider. Do not use street drugs. Do not share needles. Ask your health care provider for help if you need support or information about quitting drugs. General instructions Schedule regular health, dental, and eye exams. Stay current with your vaccines. Tell your health care provider if: You often feel depressed. You have ever been abused or do not feel safe at home. Summary Adopting a healthy lifestyle and getting preventive care are important in promoting health and wellness. Follow your health care provider's instructions about healthy diet, exercising, and getting tested or screened for diseases. Follow your health care  provider's instructions on monitoring your cholesterol and blood pressure. This information is not intended to replace advice given to you by your health care provider. Make sure you discuss any questions you have with your health care provider. Document Revised: 07/07/2020 Document Reviewed: 07/07/2020 Elsevier Patient Education  2024 [employer].

## 2022-12-16 NOTE — Assessment & Plan Note (Signed)
Considering applying coconut oil to the vulva after showers. You could also using daily vaginal moisturizers by brands like Good Clean Love and Ah! Yes.

## 2022-12-16 NOTE — Assessment & Plan Note (Addendum)
Continue vitamin D+Calcium Encouraged weight based exercise DXA completed 2024, managed by PCP

## 2022-12-16 NOTE — Progress Notes (Signed)
62 y.o. G0P0000 postmenopausal female with osteopenia, DVT on eliquis here for annual exam. Married.  Postmenopausal bleeding: none Pelvic discharge or pain: none Breast mass, nipple discharge or skin changes : none Last PAP:     Component Value Date/Time   DIAGPAP  11/11/2020 1039    - Negative for intraepithelial lesion or malignancy (NILM)   ADEQPAP  11/11/2020 1039    Satisfactory for evaluation; transformation zone component PRESENT.   Last mammogram: 01/13/22 bi-rads 1, density b Last colonoscopy: UTD Last DXA: UTD, completed by PCP Sexually active: yes, does have some discomfort with intercourse, not using a vaginal moisyurizer Exercising: regularly, walks and hikes  GYN HISTORY: Osteopenia  OB History  Gravida Para Term Preterm AB Living  0 0 0 0 0 0  SAB IAB Ectopic Multiple Live Births  0 0 0 0 0    Past Medical History:  Diagnosis Date   DVT (deep venous thrombosis) ([facility])    2009, 2023   GERD (gastroesophageal reflux disease)    Hypothyroidism    Osteopenia     Past Surgical History:  Procedure Laterality Date   WISDOM TOOTH EXTRACTION      Current Outpatient Medications on File Prior to Visit  Medication Sig Dispense Refill   Ascorbic Acid (VITAMIN C PO) Take by mouth.     Casanthranol-Docusate Sodium (STOOL SOFTENER PLUS PO) Take by mouth.     Cholecalciferol (VITAMIN D3 PO) Take 25 mcg by mouth.     ELIQUIS 5 MG TABS tablet Take 5 mg by mouth as needed.     esomeprazole (NEXIUM) 40 MG capsule Take 40 mg by mouth daily at 12 noon.     Flaxseed, Linseed, (FLAX SEED OIL) 1000 MG CAPS Take 1 capsule by mouth daily.     levothyroxine (SYNTHROID, LEVOTHROID) 100 MCG tablet Take 100 mcg by mouth daily before breakfast.     No current facility-administered medications on file prior to visit.    Social History   Socioeconomic History   Marital status: Married    Spouse name: Not on file   Number of children: 0   Years of education: Not on file    Highest education level: Not on file  Occupational History   Occupation: self employed  Tobacco Use   Smoking status: Former    Current packs/day: 0.00    Types: Cigarettes    Quit date: 10/14/1979    Years since quitting: 43.2   Smokeless tobacco: Never  Vaping Use   Vaping status: Never Used  Substance and Sexual Activity   Alcohol use: No   Drug use: No   Sexual activity: Yes    Partners: Male    Birth control/protection: Post-menopausal    Comment: 1st intercourse- 14, partners- 5  Other Topics Concern   Not on file  Social History Narrative   Not on file   Social Determinants of Health   Financial Resource Strain: Not on file  Food Insecurity: Not on file  Transportation Needs: Not on file  Physical Activity: Not on file  Stress: Not on file  Social Connections: Not on file  Intimate Partner Violence: Not on file    Family History  Adopted: Yes  Problem Relation Age of Onset   Esophageal cancer Neg Hx    Rectal cancer Neg Hx    Prostate cancer Neg Hx     Allergies  Allergen Reactions   Polyethylene Glycol 3350 Hives      PE Today's Vitals  12/16/22 1124  BP: 110/70  Pulse: 68  SpO2: 100%  Weight: 166 lb (75.3 kg)  Height: 5' 6.5" (1.689 m)   Body mass index is 26.39 kg/m.  Physical Exam Vitals reviewed. Exam conducted with a chaperone present.  Constitutional:      General: She is not in acute distress.    Appearance: Normal appearance.  HENT:     Head: Normocephalic and atraumatic.     Nose: Nose normal.  Eyes:     Extraocular Movements: Extraocular movements intact.     Conjunctiva/sclera: Conjunctivae normal.  Neck:     Thyroid: No thyroid mass, thyromegaly or thyroid tenderness.  Pulmonary:     Effort: Pulmonary effort is normal.  Chest:     Chest wall: No mass or tenderness.  Breasts:    Right: Normal. No swelling, mass, nipple discharge, skin change or tenderness.     Left: Normal. No swelling, mass, nipple discharge, skin  change or tenderness.  Abdominal:     General: There is no distension.     Palpations: Abdomen is soft.     Tenderness: There is no abdominal tenderness.  Genitourinary:    Exam position: Lithotomy position.     Urethra: No prolapse.     Vagina: Normal. No vaginal discharge or bleeding.     Cervix: Normal. No lesion.     Uterus: Normal. Not enlarged and not tender.      Adnexa: Right adnexa normal and left adnexa normal.     Comments: dryness Musculoskeletal:        General: Normal range of motion.     Cervical back: Normal range of motion.  Lymphadenopathy:     Upper Body:     Right upper body: No axillary adenopathy.     Left upper body: No axillary adenopathy.     Lower Body: No right inguinal adenopathy. No left inguinal adenopathy.  Skin:    General: Skin is warm and dry.  Neurological:     General: No focal deficit present.     Mental Status: She is alert.  Psychiatric:        Mood and Affect: Mood normal.        Behavior: Behavior normal.       Assessment and Plan:        Well woman exam with routine gynecological exam Assessment & Plan: Cervical cancer screening performed according to ASCCP guidelines. Encouraged annual mammogram screening Colonoscopy UTD DXA UTD Labs and immunizations with her primary Encouraged safe sexual practices as indicated Encouraged healthy lifestyle practices with diet and exercise For patients under 50-70yo, I recommend 1200mg  calcium daily and 600IU of vitamin D daily.    Osteopenia of multiple sites Assessment & Plan: Continue vitamin D+Calcium Encouraged weight based exercise DXA completed 2024, managed by PCP    Genitourinary syndrome of menopause Assessment & Plan: Considering applying coconut oil to the vulva after showers. You could also using daily vaginal moisturizers by brands like Good Clean Love and Ah! Yes.      Rosalyn Gess, MD

## 2023-01-17 ENCOUNTER — Ambulatory Visit: Payer: BC Managed Care – PPO

## 2023-01-21 ENCOUNTER — Ambulatory Visit
Admission: RE | Admit: 2023-01-21 | Discharge: 2023-01-21 | Disposition: A | Discharge status: Home / Self Care | Payer: BC Managed Care – PPO | Source: Ambulatory Visit | Attending: Internal Medicine | Admitting: Internal Medicine

## 2023-01-21 DIAGNOSIS — Z1231 Encounter for screening mammogram for malignant neoplasm of breast: Secondary | ICD-10-CM | POA: Diagnosis not present

## 2023-04-13 DIAGNOSIS — E039 Hypothyroidism, unspecified: Secondary | ICD-10-CM | POA: Diagnosis not present

## 2023-04-13 DIAGNOSIS — D649 Anemia, unspecified: Secondary | ICD-10-CM | POA: Diagnosis not present

## 2023-04-13 DIAGNOSIS — E559 Vitamin D deficiency, unspecified: Secondary | ICD-10-CM | POA: Diagnosis not present

## 2023-04-29 DIAGNOSIS — Z1331 Encounter for screening for depression: Secondary | ICD-10-CM | POA: Diagnosis not present

## 2023-04-29 DIAGNOSIS — R001 Bradycardia, unspecified: Secondary | ICD-10-CM | POA: Diagnosis not present

## 2023-04-29 DIAGNOSIS — E039 Hypothyroidism, unspecified: Secondary | ICD-10-CM | POA: Diagnosis not present

## 2023-04-29 DIAGNOSIS — Z1339 Encounter for screening examination for other mental health and behavioral disorders: Secondary | ICD-10-CM | POA: Diagnosis not present

## 2023-04-29 DIAGNOSIS — Z0000 Encounter for general adult medical examination without abnormal findings: Secondary | ICD-10-CM | POA: Diagnosis not present

## 2023-10-11 IMAGING — MG MM DIGITAL SCREENING BILAT W/ TOMO AND CAD
8 series · 9 of 24 positions shown · non-contrast
Comparison: Previous exam(s).

CLINICAL DATA: Screening.

EXAM:
DIGITAL SCREENING BILATERAL MAMMOGRAM WITH TOMOSYNTHESIS AND CAD
TECHNIQUE: Bilateral screening digital craniocaudal and mediolateral oblique
mammograms were obtained. Bilateral screening digital breast
tomosynthesis was performed. The images were evaluated with
computer-aided detection.

[L CC synth-2D]
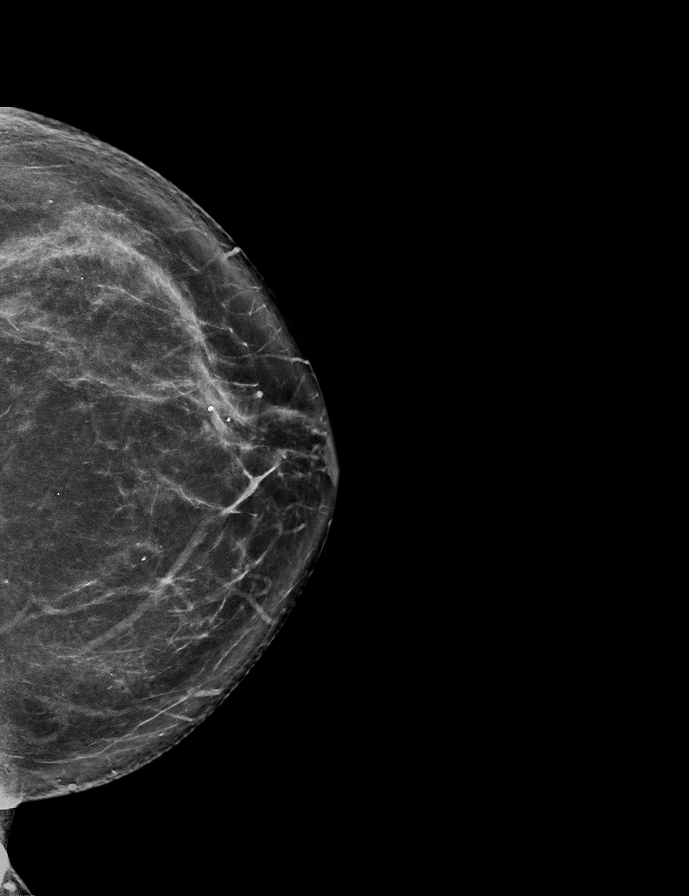

[R CC synth-2D]
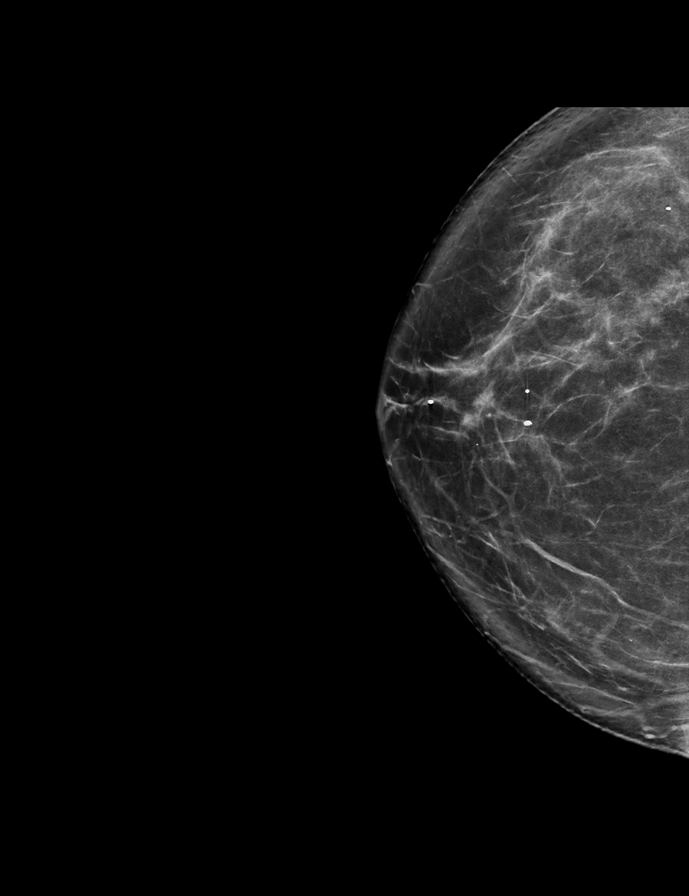

[L MLO synth-2D]
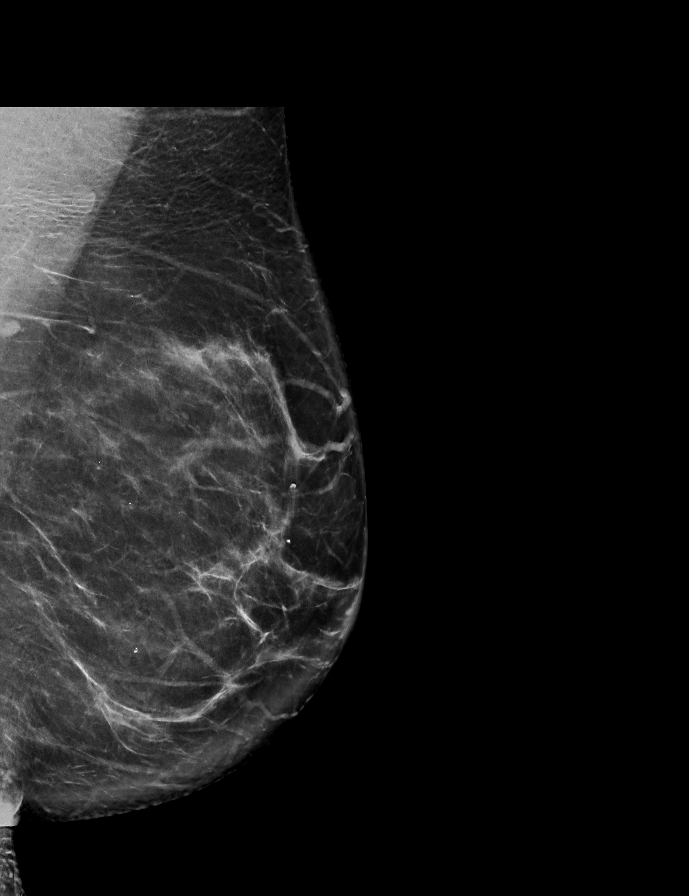

[R MLO synth-2D]
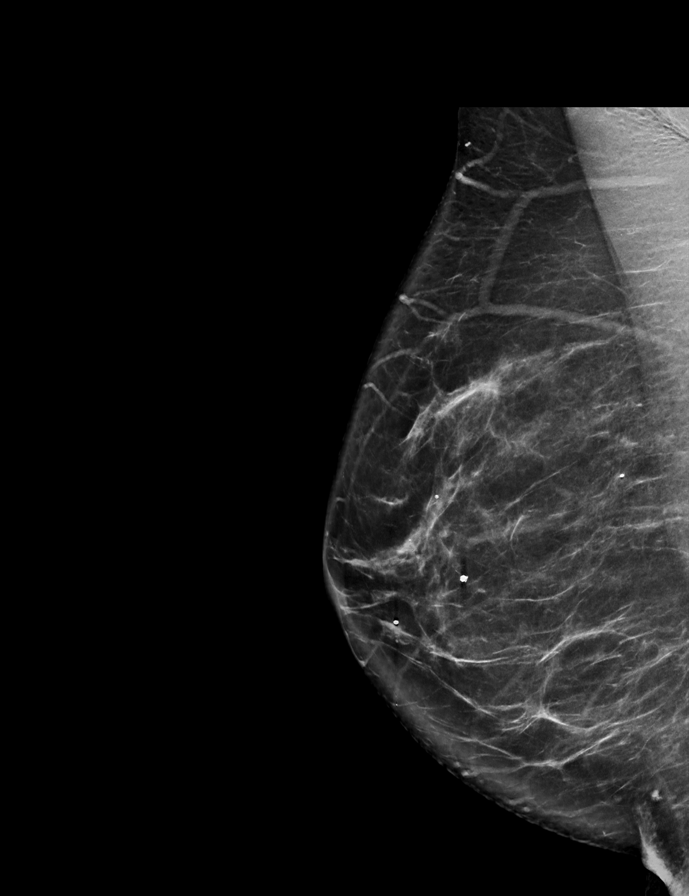

[R MLO tomo · 2 of 85 frames shown]
[frame 28/85]
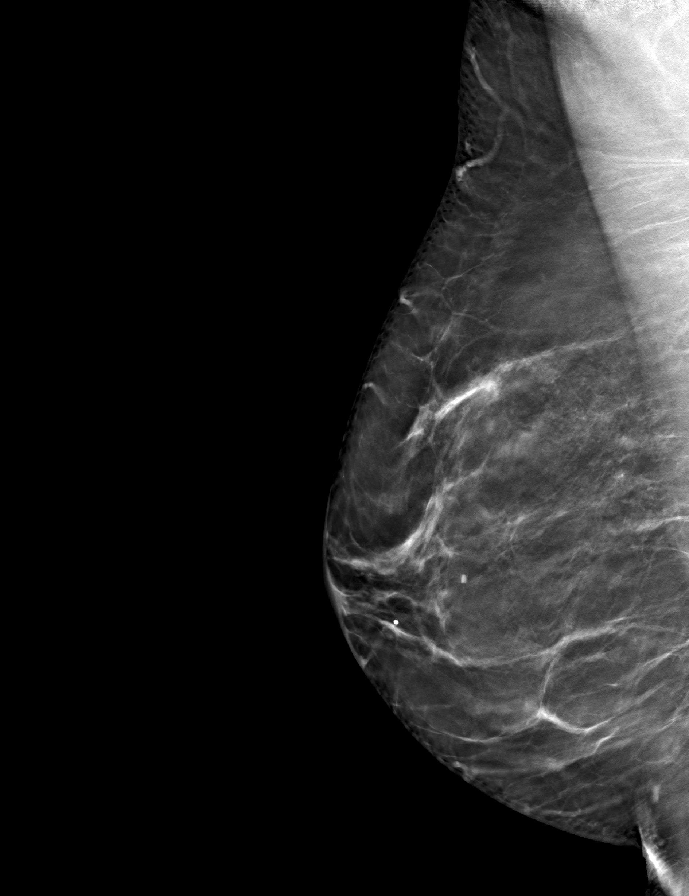
[frame 43/85]
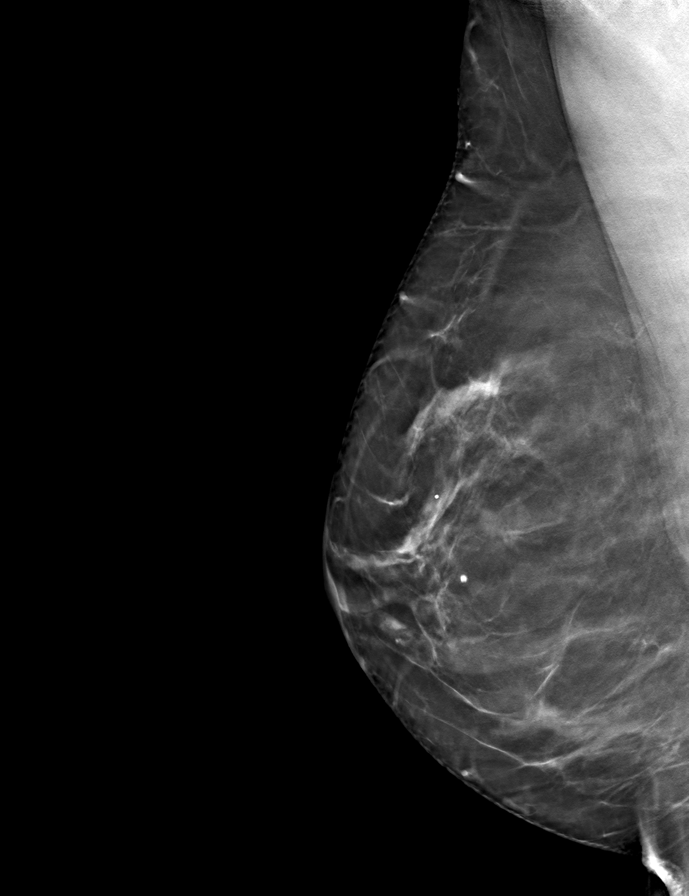

[L MLO tomo · tomo slice 45/90.0]
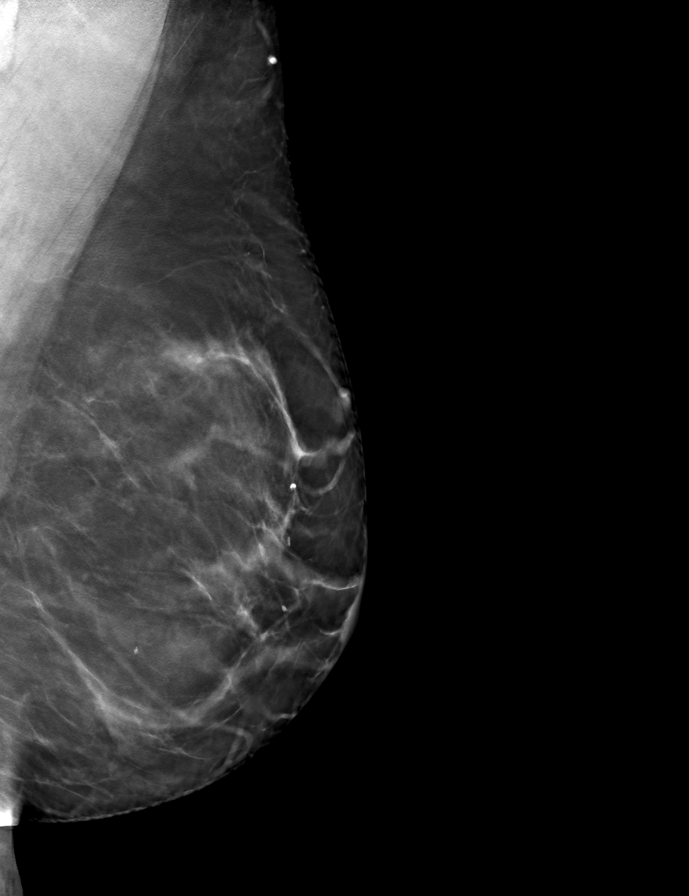

[R CC tomo · tomo slice 41/81.0]
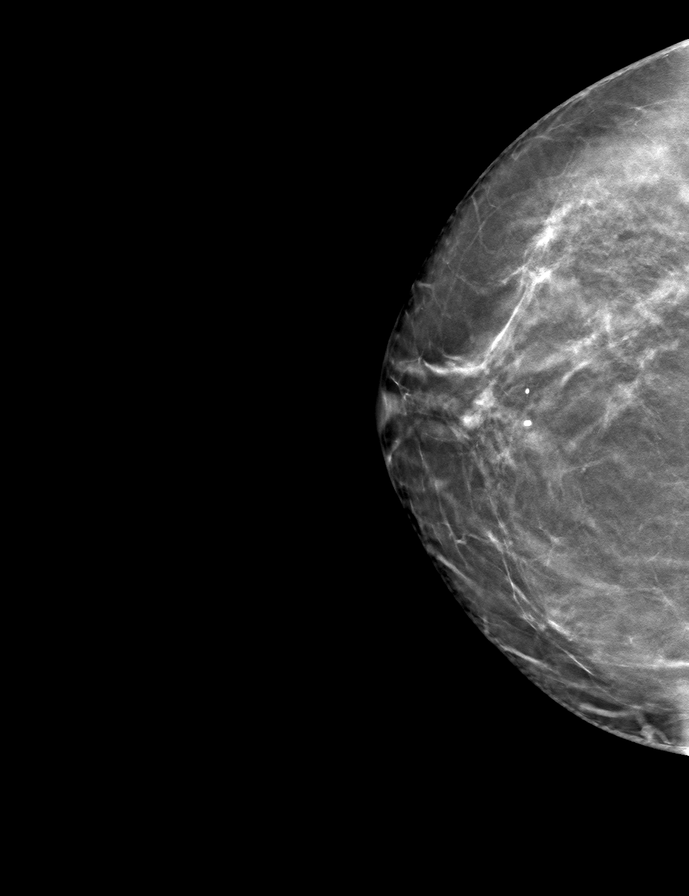

[L CC tomo · tomo slice 43/85.0]
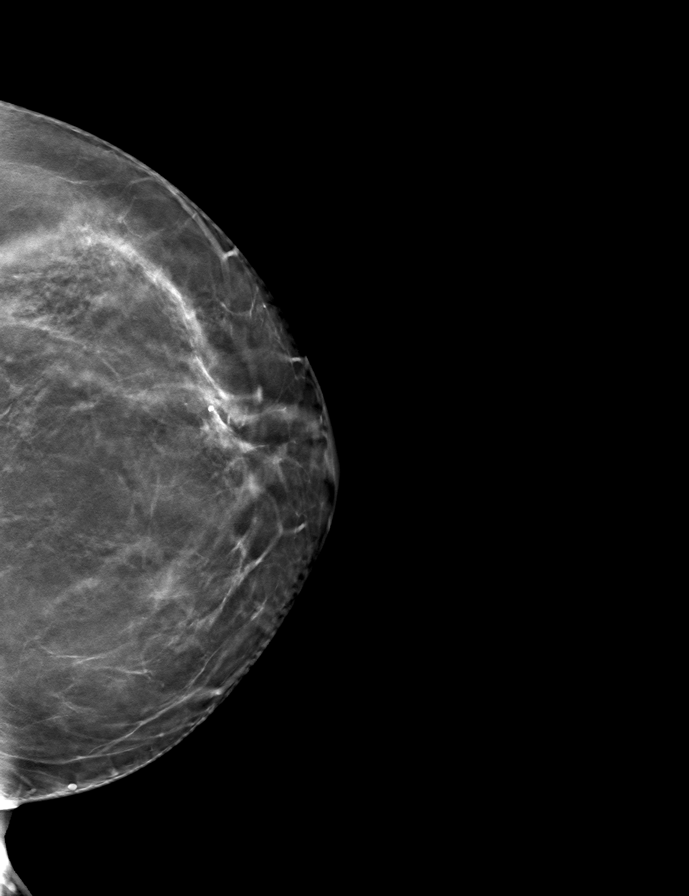

[9 of 24 positions shown; findings below may reference images not displayed]

ACR Breast Density Category b: There are scattered areas of
fibroglandular density.
FINDINGS: There are no findings suspicious for malignancy.
IMPRESSION: No mammographic evidence of malignancy. A result letter of this
screening mammogram will be mailed directly to the patient.

RECOMMENDATION:
Screening mammogram in one year. (Code:51-O-LD2)

BI-RADS CATEGORY  1: Negative.

## 2023-10-18 ENCOUNTER — Other Ambulatory Visit: Payer: Self-pay | Admitting: Internal Medicine

## 2023-10-18 DIAGNOSIS — Z1231 Encounter for screening mammogram for malignant neoplasm of breast: Secondary | ICD-10-CM

## 2023-12-19 ENCOUNTER — Ambulatory Visit (INDEPENDENT_AMBULATORY_CARE_PROVIDER_SITE_OTHER): Payer: Self-pay | Admitting: Obstetrics and Gynecology

## 2023-12-19 ENCOUNTER — Other Ambulatory Visit (HOSPITAL_COMMUNITY)
Admission: RE | Admit: 2023-12-19 | Discharge: 2023-12-19 | Disposition: A | Discharge status: Home / Self Care | Source: Ambulatory Visit | Attending: Obstetrics and Gynecology | Admitting: Obstetrics and Gynecology

## 2023-12-19 ENCOUNTER — Encounter: Payer: Self-pay | Admitting: Obstetrics and Gynecology

## 2023-12-19 VITALS — BP 142/76 | HR 60 | Temp 98.1°F | Ht 67.25 in | Wt 172.0 lb

## 2023-12-19 DIAGNOSIS — Z01419 Encounter for gynecological examination (general) (routine) without abnormal findings: Secondary | ICD-10-CM

## 2023-12-19 DIAGNOSIS — N952 Postmenopausal atrophic vaginitis: Secondary | ICD-10-CM

## 2023-12-19 DIAGNOSIS — Z124 Encounter for screening for malignant neoplasm of cervix: Secondary | ICD-10-CM

## 2023-12-19 DIAGNOSIS — Z9189 Other specified personal risk factors, not elsewhere classified: Secondary | ICD-10-CM | POA: Diagnosis not present

## 2023-12-19 DIAGNOSIS — Z1331 Encounter for screening for depression: Secondary | ICD-10-CM | POA: Diagnosis not present

## 2023-12-19 DIAGNOSIS — Z1151 Encounter for screening for human papillomavirus (HPV): Secondary | ICD-10-CM | POA: Diagnosis not present

## 2023-12-19 DIAGNOSIS — N958 Other specified menopausal and perimenopausal disorders: Secondary | ICD-10-CM

## 2023-12-19 DIAGNOSIS — M8589 Other specified disorders of bone density and structure, multiple sites: Secondary | ICD-10-CM

## 2023-12-19 NOTE — Patient Instructions (Signed)
 For patients under 50-65yo, I recommend 1200mg  calcium  daily and 600IU of vitamin D daily. For patients over 65yo, I recommend 1200mg  calcium  daily and 800IU of vitamin D daily.  Health Maintenance, Female Adopting a healthy lifestyle and getting preventive care are important in promoting health and wellness. Ask your health care provider about: The right schedule for you to have regular tests and exams. Things you can do on your own to prevent diseases and keep yourself healthy. What should I know about diet, weight, and exercise? Eat a healthy diet  Eat a diet that includes plenty of vegetables, fruits, low-fat dairy products, and lean protein. Do not eat a lot of foods that are high in solid fats, added sugars, or sodium. Maintain a healthy weight Body mass index (BMI) is used to identify weight problems. It estimates body fat based on height and weight. Your health care provider can help determine your BMI and help you achieve or maintain a healthy weight. Get regular exercise Get regular exercise. This is one of the most important things you can do for your health. Most adults should: Exercise for at least 150 minutes each week. The exercise should increase your heart rate and make you sweat (moderate-intensity exercise). Do strengthening exercises at least twice a week. This is in addition to the moderate-intensity exercise. Spend less time sitting. Even light physical activity can be beneficial. Watch cholesterol and blood lipids Have your blood tested for lipids and cholesterol at 65 years of age, then have this test every 5 years. Have your cholesterol levels checked more often if: Your lipid or cholesterol levels are high. You are older than 65 years of age. You are at high risk for heart disease. What should I know about cancer screening? Depending on your health history and family history, you may need to have cancer screening at various ages. This may include screening  for: Breast cancer. Cervical cancer. Colorectal cancer. Skin cancer. Lung cancer. What should I know about heart disease, diabetes, and high blood pressure? Blood pressure and heart disease High blood pressure causes heart disease and increases the risk of stroke. This is more likely to develop in people who have high blood pressure readings or are overweight. Have your blood pressure checked: Every 3-5 years if you are 25-57 years of age. Every year if you are 24 years old or older. Diabetes Have regular diabetes screenings. This checks your fasting blood sugar level. Have the screening done: Once every three years after age 62 if you are at a normal weight and have a low risk for diabetes. More often and at a younger age if you are overweight or have a high risk for diabetes. What should I know about preventing infection? Hepatitis B If you have a higher risk for hepatitis B, you should be screened for this virus. Talk with your health care provider to find out if you are at risk for hepatitis B infection. Hepatitis C Testing is recommended for: Everyone born from [birth year] through 1967. Anyone with known risk factors for hepatitis C. Sexually transmitted infections (STIs) Get screened for STIs, including gonorrhea and chlamydia, if: You are sexually active and are younger than 65 years of age. You are older than 65 years of age and your health care provider tells you that you are at risk for this type of infection. Your sexual activity has changed since you were last screened, and you are at increased risk for chlamydia or gonorrhea. Ask your health care provider if  you are at risk. Ask your health care provider about whether you are at high risk for HIV. Your health care provider may recommend a prescription medicine to help prevent HIV infection. If you choose to take medicine to prevent HIV, you should first get tested for HIV. You should then be tested every 3 months for as long as you  are taking the medicine. Osteoporosis and menopause Osteoporosis is a disease in which the bones lose minerals and strength with aging. This can result in bone fractures. If you are 72 years old or older, or if you are at risk for osteoporosis and fractures, ask your health care provider if you should: Be screened for bone loss. Take a calcium  or vitamin D supplement to lower your risk of fractures. Be given hormone replacement therapy (HRT) to treat symptoms of menopause. Follow these instructions at home: Alcohol use Do not drink alcohol if: Your health care provider tells you not to drink. You are pregnant, may be pregnant, or are planning to become pregnant. If you drink alcohol: Limit how much you have to: 0-1 drink a day. Know how much alcohol is in your drink. In the [place] one drink equals one 12 oz bottle of beer (355 mL), one 5 oz glass of wine (148 mL), or one 1 oz glass of hard liquor (44 mL). Lifestyle Do not use any products that contain nicotine or tobacco. These products include cigarettes, chewing tobacco, and vaping devices, such as e-cigarettes. If you need help quitting, ask your health care provider. Do not use street drugs. Do not share needles. Ask your health care provider for help if you need support or information about quitting drugs. General instructions Schedule regular health, dental, and eye exams. Stay current with your vaccines. Tell your health care provider if: You often feel depressed. You have ever been abused or do not feel safe at home. Summary Adopting a healthy lifestyle and getting preventive care are important in promoting health and wellness. Follow your health care provider's instructions about healthy diet, exercising, and getting tested or screened for diseases. Follow your health care provider's instructions on monitoring your cholesterol and blood pressure. This information is not intended to replace advice given to you by your health  care provider. Make sure you discuss any questions you have with your health care provider. Document Revised: 07/07/2020 Document Reviewed: 07/07/2020 Elsevier Patient Education  2024 [employer].

## 2023-12-19 NOTE — Assessment & Plan Note (Signed)
 Considering applying aquaphor or coconut oil to the vulva after showers. You could also using daily vaginal moisturizers by brands like Good Clean Love and Ah! Yes.

## 2023-12-19 NOTE — Progress Notes (Addendum)
 63 y.o. G0P0000 postmenopausal female with GSM, osteopenia, DVT on eliquis here for annual exam. Married. Self-employed-executive leadership coach, looking to retire. PCP: Larnell Hamilton, MD  She is having cataract surgery in January.  Her husband recently had cataract surgery and did well.  Postmenopausal bleeding: none Pelvic discharge or pain: none Breast mass, nipple discharge or skin changes : none  H/o abnl PAP: yes, 50s, no procedures required Last PAP:     Component Value Date/Time   DIAGPAP  11/11/2020 1039    - Negative for intraepithelial lesion or malignancy (NILM)   ADEQPAP  11/11/2020 1039    Satisfactory for evaluation; transformation zone component PRESENT.   Last mammogram: 01/21/23  bi-rads 1, density b Last colonoscopy: 2023 + polyps, q61yr Last DXA: Feb 2024, osteopenia, managed by PCP Sexually active: yes, does have some discomfort with intercourse, not using a vaginal moisturizer Exercising: swims 2x/wk, walks and hikes ~3x/wk, using light weights  Flowsheet Row Office Visit from 12/19/2023 in [facility]  PHQ-2 Total Score 0    GYN HISTORY: Osteopenia [facility]partners  OB History  Gravida Para Term Preterm AB Living  0 0 0 0 0 0  SAB IAB Ectopic Multiple Live Births  0 0 0 0 0    Past Medical History:  Diagnosis Date   DVT (deep venous thrombosis) (HCC)    2009, 2023   GERD (gastroesophageal reflux disease)    Hypothyroidism    Osteopenia     Past Surgical History:  Procedure Laterality Date   WISDOM TOOTH EXTRACTION      Current Outpatient Medications on File Prior to Visit  Medication Sig Dispense Refill   Ascorbic Acid (VITAMIN C PO) Take by mouth.     Casanthranol-Docusate Sodium (STOOL SOFTENER PLUS PO) Take by mouth.     Cholecalciferol (VITAMIN D3 PO) Take 25 mcg by mouth.     ELIQUIS 5 MG TABS tablet Take 5 mg by mouth as needed.     esomeprazole (NEXIUM) 40 MG capsule Take 40 mg by  mouth daily at 12 noon.     Flaxseed, Linseed, (FLAX SEED OIL) 1000 MG CAPS Take 1 capsule by mouth daily.     levothyroxine (SYNTHROID, LEVOTHROID) 100 MCG tablet Take 100 mcg by mouth daily before breakfast.     No current facility-administered medications on file prior to visit.    Social History   Socioeconomic History   Marital status: Married    Spouse name: Not on file   Number of children: 0   Years of education: Not on file   Highest education level: Not on file  Occupational History   Occupation: self employed  Tobacco Use   Smoking status: Former    Current packs/day: 0.00    Types: Cigarettes    Quit date: 10/14/1979    Years since quitting: 44.2   Smokeless tobacco: Never  Vaping Use   Vaping status: Never Used  Substance and Sexual Activity   Alcohol  use: No   Drug use: No   Sexual activity: Yes    Partners: Male    Birth control/protection: Post-menopausal    Comment: 1st intercourse- 14, partners- 5  Other Topics Concern   Not on file  Social History Narrative   Not on file   Social Drivers of Health   Financial Resource Strain: Not on file  Food Insecurity: Not on file  Transportation Needs: Not on file  Physical Activity: Not on file  Stress: Not on file  Social Connections: Not on file  Intimate Partner Violence: Not on file    Family History  Adopted: Yes  Problem Relation Age of Onset   Esophageal cancer Neg Hx    Rectal cancer Neg Hx    Prostate cancer Neg Hx     Allergies  Allergen Reactions   Polyethylene Glycol 3350  Hives      PE Today's Vitals   12/19/23 1343  BP: (!) 142/76  Pulse: 60  Temp: 98.1 F (36.7 C)  TempSrc: Oral  SpO2: 99%  Weight: 172 lb (78 kg)  Height: 5' 7.25 (1.708 m)   Body mass index is 26.74 kg/m.  Physical Exam Vitals reviewed. Exam conducted with a chaperone present.  Constitutional:      General: She is not in acute distress.    Appearance: Normal appearance.  HENT:     Head:  Normocephalic and atraumatic.     Nose: Nose normal.  Eyes:     Extraocular Movements: Extraocular movements intact.     Conjunctiva/sclera: Conjunctivae normal.  Neck:     Thyroid : No thyroid  mass, thyromegaly or thyroid  tenderness.  Pulmonary:     Effort: Pulmonary effort is normal.  Chest:     Chest wall: No mass or tenderness.  Breasts:    Right: Normal. No swelling, mass, nipple discharge, skin change or tenderness.     Left: Normal. No swelling, mass, nipple discharge, skin change or tenderness.  Abdominal:     General: There is no distension.     Palpations: Abdomen is soft.     Tenderness: There is no abdominal tenderness.  Genitourinary:    Exam position: Lithotomy position.     Urethra: No prolapse.     Vagina: Normal. No vaginal discharge or bleeding.     Cervix: Normal. No lesion.     Uterus: Normal. Not enlarged and not tender.      Adnexa: Right adnexa normal and left adnexa normal.     Comments: Vaginal atrophy Musculoskeletal:        General: Normal range of motion.     Cervical back: Normal range of motion.  Lymphadenopathy:     Upper Body:     Right upper body: No axillary adenopathy.     Left upper body: No axillary adenopathy.     Lower Body: No right inguinal adenopathy. No left inguinal adenopathy.  Skin:    General: Skin is warm and dry.  Neurological:     General: No focal deficit present.     Mental Status: She is alert.  Psychiatric:        Mood and Affect: Mood normal.        Behavior: Behavior normal.       Assessment and Plan:        Well woman exam with routine gynecological exam Assessment & Plan: Cervical cancer screening performed according to ASCCP guidelines. Encouraged annual mammogram screening Colonoscopy UTD DXA UTD Labs and immunizations with her primary Encouraged safe sexual practices as indicated Encouraged healthy lifestyle practices with diet and exercise For patients under 50-70yo, I recommend 1200mg  calcium daily  and 600IU of vitamin D daily.    Cervical cancer screening -     Cytology - PAP  Osteopenia of multiple sites Assessment & Plan: Continue vitamin D+Calcium Encouraged weight based exercise DXA completed 2024, managed by PCP    Genitourinary syndrome of menopause Assessment & Plan: Considering applying aquaphor or coconut oil to the vulva after showers. You could also using daily  vaginal moisturizers by brands like Good Clean Love and Ah! Yes.     Vera LULLA Pa, MD

## 2023-12-19 NOTE — Assessment & Plan Note (Signed)
Continue vitamin D+Calcium Encouraged weight based exercise DXA completed 2024, managed by PCP

## 2023-12-19 NOTE — Assessment & Plan Note (Signed)
 Cervical cancer screening performed according to ASCCP guidelines. Encouraged annual mammogram screening Colonoscopy UTD DXA UTD Labs and immunizations with her primary Encouraged safe sexual practices as indicated Encouraged healthy lifestyle practices with diet and exercise For patients under 50-65yo, I recommend 1200mg  calcium daily and 600IU of vitamin D daily.

## 2023-12-19 NOTE — Addendum Note (Signed)
 Addended by: DALLIE BOLLARD V on: 12/19/2023 02:20 PM   Modules accepted: Level of Service

## 2023-12-20 LAB — CYTOLOGY - PAP
Comment: NEGATIVE
Diagnosis: NEGATIVE
High risk HPV: NEGATIVE

## 2023-12-21 ENCOUNTER — Ambulatory Visit: Payer: Self-pay | Admitting: Obstetrics and Gynecology

## 2024-01-23 ENCOUNTER — Ambulatory Visit
Admission: RE | Admit: 2024-01-23 | Discharge: 2024-01-23 | Disposition: A | Source: Ambulatory Visit | Attending: Internal Medicine | Admitting: Internal Medicine

## 2024-01-23 DIAGNOSIS — Z1231 Encounter for screening mammogram for malignant neoplasm of breast: Secondary | ICD-10-CM

## 2024-12-20 ENCOUNTER — Ambulatory Visit: Admitting: Obstetrics and Gynecology
